# Patient Record
Sex: Male | Born: 1975 | Race: White | Hispanic: No | Marital: Single | Smoking: Former smoker
Health system: Southern US, Community
[De-identification: ages and names within clinical notes are randomized; demographics above are authoritative.]

## PROBLEM LIST (undated history)

## (undated) DIAGNOSIS — B029 Zoster without complications: Secondary | ICD-10-CM

## (undated) HISTORY — PX: WISDOM TOOTH EXTRACTION: SHX21

## (undated) HISTORY — DX: Zoster without complications: B02.9

---

## 2015-04-18 ENCOUNTER — Encounter: Payer: Self-pay | Admitting: Family Medicine

## 2015-04-18 ENCOUNTER — Ambulatory Visit (INDEPENDENT_AMBULATORY_CARE_PROVIDER_SITE_OTHER): Payer: BLUE CROSS/BLUE SHIELD | Admitting: Family Medicine

## 2015-04-18 VITALS — BP 140/82 | HR 68 | Temp 98.2°F | Resp 16 | Ht 67.0 in | Wt 152.1 lb

## 2015-04-18 DIAGNOSIS — Z1211 Encounter for screening for malignant neoplasm of colon: Secondary | ICD-10-CM | POA: Insufficient documentation

## 2015-04-18 DIAGNOSIS — Z0000 Encounter for general adult medical examination without abnormal findings: Secondary | ICD-10-CM | POA: Insufficient documentation

## 2015-04-18 NOTE — Progress Notes (Signed)
Name: Roy Edwards   MRN: [id number]    DOB: 10-10-1975   Date:04/18/2015       Progress Note  Subjective  Chief Complaint  Chief Complaint  Patient presents with  . Annual Exam    HPI  Patient here for complete H&P. His baseline health status is unchanged.  History reviewed. No pertinent past medical history.  History  Substance Use Topics  . Smoking status: Former Games developer  . Smokeless tobacco: Not on file  . Alcohol Use: 0.0 oz/week    0 Standard drinks or equivalent per week     Current outpatient prescriptions:  .  loratadine (CLARITIN) 10 MG tablet, Take 10 mg by mouth daily., Disp: , Rfl:  .  Multiple Vitamin (MULTIVITAMIN) tablet, Take 1 tablet by mouth daily., Disp: , Rfl:   No Known Allergies  Review of Systems  Constitutional: Negative for fever, chills and weight loss.  HENT: Negative for congestion, hearing loss, sore throat and tinnitus.   Eyes: Negative for blurred vision, double vision and redness.  Respiratory: Negative for cough, hemoptysis and shortness of breath.   Cardiovascular: Negative for chest pain, palpitations, orthopnea, claudication and leg swelling.  Gastrointestinal: Negative for heartburn, nausea, vomiting, diarrhea, constipation and blood in stool.  Genitourinary: Negative for dysuria, urgency, frequency and hematuria.  Musculoskeletal: Negative for myalgias, back pain, joint pain, falls and neck pain.  Skin: Negative for itching.  Neurological: Negative for dizziness, tingling, tremors, focal weakness, seizures, loss of consciousness, weakness and headaches.  Endo/Heme/Allergies: Does not bruise/bleed easily.  Psychiatric/Behavioral: Negative for depression and substance abuse. The patient is not nervous/anxious and does not have insomnia.      Objective  Filed Vitals:   04/18/15 0943  BP: 140/82  Pulse: 68  Temp: 98.2 F (36.8 C)  Resp: 16  Height:  (1.803 m)  Weight: 152 lb 1 oz (68.975 kg)  SpO2: 98%     Physical  Exam  Constitutional: He is oriented to person, place, and time and well-developed, well-nourished, and in no distress.  HENT:  Head: Normocephalic.  Eyes: EOM are normal. Pupils are equal, round, and reactive to light.  Neck: Normal range of motion. Neck supple. No thyromegaly present.  Cardiovascular: Normal rate, regular rhythm and normal heart sounds.   No murmur heard. Pulmonary/Chest: Effort normal and breath sounds normal. No respiratory distress. He has no wheezes.  Abdominal: Soft. Bowel sounds are normal.  Genitourinary: Penis normal. No discharge found.  Musculoskeletal: Normal range of motion. He exhibits no edema.  Lymphadenopathy:    He has no cervical adenopathy.  Neurological: He is alert and oriented to person, place, and time. No cranial nerve deficit. Gait normal. Coordination normal.  Skin: Skin is warm and dry. No rash noted.  Psychiatric: Affect and judgment normal.      Assessment & Plan  1. Annual physical exam  - CBC - Comprehensive metabolic panel - Lipid panel - TSH

## 2015-04-19 ENCOUNTER — Telehealth: Payer: Self-pay | Admitting: Emergency Medicine

## 2015-04-19 LAB — COMPREHENSIVE METABOLIC PANEL
ALBUMIN: 4.3 g/dL (ref 3.5–5.5)
ALT: 21 IU/L (ref 0–44)
AST: 15 IU/L (ref 0–40)
Albumin/Globulin Ratio: 1.8 (ref 1.1–2.5)
Alkaline Phosphatase: 74 IU/L (ref 39–117)
BILIRUBIN TOTAL: 1.2 mg/dL (ref 0.0–1.2)
BUN/Creatinine Ratio: 13 (ref 8–19)
BUN: 12 mg/dL (ref 6–20)
CALCIUM: 9.5 mg/dL (ref 8.7–10.2)
CHLORIDE: 100 mmol/L (ref 97–108)
CO2: 25 mmol/L (ref 18–29)
CREATININE: 0.92 mg/dL (ref 0.76–1.27)
GFR calc non Af Amer: 105 mL/min/{1.73_m2} (ref >59)
GFR, EST AFRICAN AMERICAN: 122 mL/min/{1.73_m2} (ref >59)
Globulin, Total: 2.4 g/dL (ref 1.5–4.5)
Glucose: 98 mg/dL (ref 65–99)
POTASSIUM: 4.9 mmol/L (ref 3.5–5.2)
Sodium: 139 mmol/L (ref 134–144)
Total Protein: 6.7 g/dL (ref 6.0–8.5)

## 2015-04-19 LAB — LIPID PANEL
CHOL/HDL RATIO: 4 ratio (ref 0.0–5.0)
Cholesterol, Total: 181 mg/dL (ref 100–199)
HDL: 45 mg/dL (ref >39)
LDL CALC: 116 mg/dL — AB (ref 0–99)
Triglycerides: 102 mg/dL (ref 0–149)
VLDL CHOLESTEROL CAL: 20 mg/dL (ref 5–40)

## 2015-04-19 LAB — CBC
Hematocrit: 47 % (ref 37.5–51.0)
Hemoglobin: 16.4 g/dL (ref 12.6–17.7)
MCH: 32.1 pg (ref 26.6–33.0)
MCHC: 34.9 g/dL (ref 31.5–35.7)
MCV: 92 fL (ref 79–97)
Platelets: 277 10*3/uL (ref 150–379)
RBC: 5.11 x10E6/uL (ref 4.14–5.80)
RDW: 14.5 % (ref 12.3–15.4)
WBC: 7.4 10*3/uL (ref 3.4–10.8)

## 2015-04-19 LAB — TSH: TSH: 1.18 u[IU]/mL (ref 0.450–4.500)

## 2015-04-19 NOTE — Telephone Encounter (Signed)
Patient notified

## 2015-08-15 ENCOUNTER — Ambulatory Visit (INDEPENDENT_AMBULATORY_CARE_PROVIDER_SITE_OTHER): Payer: BLUE CROSS/BLUE SHIELD | Admitting: Family Medicine

## 2015-08-15 ENCOUNTER — Encounter: Payer: Self-pay | Admitting: Family Medicine

## 2015-08-15 VITALS — BP 126/82 | HR 72 | Temp 98.1°F | Resp 16 | Ht 67.0 in | Wt 153.9 lb

## 2015-08-15 DIAGNOSIS — B029 Zoster without complications: Secondary | ICD-10-CM

## 2015-08-15 MED ORDER — VALACYCLOVIR HCL 1 G PO TABS: 1000.0000 mg | ORAL_TABLET | Freq: Two times a day (BID) | ORAL | Status: DC

## 2015-08-15 MED ORDER — PREDNISONE 20 MG PO TABS
20.0000 mg | ORAL_TABLET | Freq: Every day | ORAL | Status: DC
Start: 2015-08-15 — End: 2016-05-14

## 2015-08-15 NOTE — Patient Instructions (Signed)

## 2015-08-15 NOTE — Progress Notes (Signed)
Name: Roy SheerJohn Edwards   MRN: [id number]    DOB: 11/03/1975   Date:08/15/2015       Progress Note  Subjective  Chief Complaint  Chief Complaint  Patient presents with  . Rash    in middle of back onset 3-4 days denies itching but painful.  Ulcerations and ozzing possible shingles, has been under alot of stress.    HPI  Herpes zoster.  Patient complains of a rash that began on his left posterior thoracic area about 23 dictating days ago. A few days before he B Cantu experience some tingling and burning sensation before a blistering rash appeared. There's been no fever or chills and no prior exposure  History reviewed. No pertinent past medical history.  Social History  Substance Use Topics  . Smoking status: Former Games developermoker  . Smokeless tobacco: Not on file  . Alcohol Use: 0.0 oz/week    0 Standard drinks or equivalent per week     Current outpatient prescriptions:  .  loratadine (CLARITIN) 10 MG tablet, Take 10 mg by mouth daily., Disp: , Rfl:  .  Multiple Vitamin (MULTIVITAMIN) tablet, Take 1 tablet by mouth daily., Disp: , Rfl:  .  predniSONE (DELTASONE) 20 MG tablet, Take 1 tablet (20 mg total) by mouth daily with breakfast., Disp: 10 tablet, Rfl: 0 .  valACYclovir (VALTREX) 1000 MG tablet, Take 1 tablet (1,000 mg total) by mouth 2 (two) times daily., Disp: 20 tablet, Rfl: 0  No Known Allergies  Review of Systems  Constitutional: Negative.   Skin: Positive for rash.     Objective  Filed Vitals:   08/15/15 1137  BP: 126/82  Pulse: 72  Temp: 98.1 F (36.7 C)  TempSrc: Oral  Resp: 16  Height: 5\' 7"  (1.702 m)  Weight: 153 lb 14.4 oz (69.809 kg)  SpO2: 99%     Physical Exam  Constitutional: He is well-developed, well-nourished, and in no distress.  HENT:  Head: Normocephalic.  Eyes: Pupils are equal, round, and reactive to light.  Neck: Normal range of motion.  Skin: Rash:  herpetic type rash and approximately her T 8 distribution left posterior rib area.       Assessment & Plan  1. Shingles Roy DaviesHannah Edwards - valACYclovir (VALTREX) 1000 MG tablet; Take 1 tablet (1,000 mg total) by mouth 2 (two) times daily.  Dispense: 20 tablet; Refill: 0 - predniSONE (DELTASONE) 20 MG tablet; Take 1 tablet (20 mg total) by mouth daily with breakfast.  Dispense: 10 tablet; Refill: 0

## 2016-04-23 ENCOUNTER — Encounter: Payer: BLUE CROSS/BLUE SHIELD | Admitting: Family Medicine

## 2016-05-14 ENCOUNTER — Encounter: Payer: Self-pay | Admitting: Family Medicine

## 2016-05-14 ENCOUNTER — Ambulatory Visit (INDEPENDENT_AMBULATORY_CARE_PROVIDER_SITE_OTHER): Payer: BLUE CROSS/BLUE SHIELD | Admitting: Family Medicine

## 2016-05-14 VITALS — BP 122/76 | HR 66 | Temp 98.7°F | Resp 16 | Ht 67.0 in | Wt 158.8 lb

## 2016-05-14 DIAGNOSIS — Z0000 Encounter for general adult medical examination without abnormal findings: Secondary | ICD-10-CM

## 2016-05-14 LAB — LIPID PANEL
Cholesterol: 159 mg/dL (ref 125–200)
HDL: 38 mg/dL — ABNORMAL LOW (ref >=40)
LDL Cholesterol: 90 mg/dL (ref <130)
Total CHOL/HDL Ratio: 4.2 Ratio (ref <=5.0)
Triglycerides: 157 mg/dL — ABNORMAL HIGH (ref <150)
VLDL: 31 mg/dL — ABNORMAL HIGH (ref <30)

## 2016-05-14 LAB — CBC WITH DIFFERENTIAL/PLATELET
Basophils Absolute: 0 cells/uL (ref 0–200)
Basophils Relative: 0 %
EOS PCT: 2 %
Eosinophils Absolute: 160 cells/uL (ref 15–500)
HCT: 47.8 % (ref 38.5–50.0)
Hemoglobin: 16.4 g/dL (ref 13.2–17.1)
LYMPHS ABS: 1600 {cells}/uL (ref 850–3900)
LYMPHS PCT: 20 %
MCH: 32 pg (ref 27.0–33.0)
MCHC: 34.3 g/dL (ref 32.0–36.0)
MCV: 93.4 fL (ref 80.0–100.0)
MONOS PCT: 7 %
MPV: 10.9 fL (ref 7.5–12.5)
Monocytes Absolute: 560 cells/uL (ref 200–950)
NEUTROS PCT: 71 %
Neutro Abs: 5680 cells/uL (ref 1500–7800)
PLATELETS: 281 10*3/uL (ref 140–400)
RBC: 5.12 MIL/uL (ref 4.20–5.80)
RDW: 13.4 % (ref 11.0–15.0)
WBC: 8 10*3/uL (ref 3.8–10.8)

## 2016-05-14 LAB — COMPLETE METABOLIC PANEL WITH GFR
ALT: 19 U/L (ref 9–46)
AST: 15 U/L (ref 10–40)
Albumin: 4.5 g/dL (ref 3.6–5.1)
Alkaline Phosphatase: 53 U/L (ref 40–115)
BUN: 11 mg/dL (ref 7–25)
CHLORIDE: 104 mmol/L (ref 98–110)
CO2: 29 mmol/L (ref 20–31)
Calcium: 9.4 mg/dL (ref 8.6–10.3)
Creat: 0.96 mg/dL (ref 0.60–1.35)
GFR, Est African American: >89 mL/min (ref >=60)
GFR, Est Non African American: >89 mL/min (ref >=60)
GLUCOSE: 99 mg/dL (ref 65–99)
POTASSIUM: 5 mmol/L (ref 3.5–5.3)
SODIUM: 140 mmol/L (ref 135–146)
Total Bilirubin: 0.7 mg/dL (ref 0.2–1.2)
Total Protein: 6.6 g/dL (ref 6.1–8.1)

## 2016-05-14 LAB — TSH: TSH: 1.21 mIU/L (ref 0.40–4.50)

## 2016-05-14 NOTE — Progress Notes (Signed)
Name: Roy Edwards   MRN: [id number]    DOB: 01/28/1976   Date:05/14/2016       Progress Note  Subjective  Chief Complaint  Chief Complaint  Patient presents with  . Annual Exam  This patient is usually followed by Dr. Thana AtesMorrisey, new to me  HPI  Pt. Presents for a Complete Physical Exam.   Past Medical History:  Diagnosis Date  . Shingles     History reviewed. No pertinent surgical history.  Family History  Problem Relation Age of Onset  . Graves' disease Mother   . Diabetes Father   . Atrial fibrillation Father     Social History   Social History  . Marital status: Single    Spouse name: N/A  . Number of children: N/A  . Years of education: N/A   Occupational History  . Not on file.   Social History Main Topics  . Smoking status: Former Games developermoker  . Smokeless tobacco: Never Used  . Alcohol use No  . Drug use: No  . Sexual activity: No   Other Topics Concern  . Not on file   Social History Narrative  . No narrative on file     Current Outpatient Prescriptions:  Marland Kitchen.  Multiple Vitamin (MULTIVITAMIN) tablet, Take 1 tablet by mouth daily., Disp: , Rfl:   No Known Allergies   Review of Systems  Constitutional: Negative for chills and fever.  Eyes: Negative for blurred vision and double vision.  Cardiovascular: Negative for chest pain.  Gastrointestinal: Negative for abdominal pain, blood in stool, nausea and vomiting.  Genitourinary: Negative for hematuria.  Musculoskeletal: Negative for back pain and neck pain.  Skin: Negative for rash.  Neurological: Negative for headaches.  Psychiatric/Behavioral: Negative for depression. The patient is not nervous/anxious.      Objective  Vitals:   05/14/16 0909  BP: 122/76  Pulse: 66  Resp: 16  Temp: 98.7 F (37.1 C)  TempSrc: Oral  SpO2: 96%  Weight: 158 lb 12.8 oz (72 kg)  Height: 5\' 7"  (1.702 m)    Physical Exam  Constitutional: He is oriented to person, place, and time and well-developed,  well-nourished, and in no distress.  HENT:  Head: Normocephalic and atraumatic.  Right Ear: Tympanic membrane, external ear and ear canal normal.  Left Ear: Tympanic membrane, external ear and ear canal normal.  Mouth/Throat: Oropharynx is clear and moist. No posterior oropharyngeal erythema.  Eyes: Pupils are equal, round, and reactive to light.  Cardiovascular: Normal rate, regular rhythm and normal heart sounds.   No murmur heard. Pulmonary/Chest: Effort normal and breath sounds normal. He has no wheezes.  Abdominal: Soft. Bowel sounds are normal. There is no tenderness.  Genitourinary:  Genitourinary Comments: Deferred.  Musculoskeletal:       Right ankle: He exhibits no swelling.       Left ankle: He exhibits no swelling.  Neurological: He is alert and oriented to person, place, and time.  Psychiatric: Mood, memory, affect and judgment normal.  Nursing note and vitals reviewed.    Assessment & Plan  1. Annual physical exam Age appropriate screening lab work obtained.  - Lipid Profile - CBC with Differential - COMPLETE METABOLIC PANEL WITH GFR - TSH - Vitamin D (25 hydroxy)   Wynne Jury Asad A. Faylene [facility] 05/14/2016 9:18 AM

## 2016-05-15 LAB — VITAMIN D 25 HYDROXY (VIT D DEFICIENCY, FRACTURES): Vit D, 25-Hydroxy: 48 ng/mL (ref 30–100)

## 2016-09-29 DIAGNOSIS — Z23 Encounter for immunization: Secondary | ICD-10-CM | POA: Diagnosis not present

## 2017-05-11 DIAGNOSIS — J0100 Acute maxillary sinusitis, unspecified: Secondary | ICD-10-CM | POA: Diagnosis not present

## 2017-05-11 DIAGNOSIS — J209 Acute bronchitis, unspecified: Secondary | ICD-10-CM | POA: Diagnosis not present

## 2017-05-15 ENCOUNTER — Encounter: Payer: BLUE CROSS/BLUE SHIELD | Admitting: Family Medicine

## 2017-05-31 ENCOUNTER — Encounter: Payer: BLUE CROSS/BLUE SHIELD | Admitting: Family Medicine

## 2017-06-20 DIAGNOSIS — Z23 Encounter for immunization: Secondary | ICD-10-CM | POA: Diagnosis not present

## 2017-06-25 ENCOUNTER — Ambulatory Visit (INDEPENDENT_AMBULATORY_CARE_PROVIDER_SITE_OTHER): Payer: BLUE CROSS/BLUE SHIELD | Admitting: Family Medicine

## 2017-06-25 ENCOUNTER — Encounter: Payer: Self-pay | Admitting: Family Medicine

## 2017-06-25 VITALS — BP 117/80 | HR 71 | Ht 68.0 in | Wt 159.0 lb

## 2017-06-25 DIAGNOSIS — Z0000 Encounter for general adult medical examination without abnormal findings: Secondary | ICD-10-CM

## 2017-06-25 NOTE — Progress Notes (Signed)
Name: Roy Edwards   MRN: [id number]    DOB: 04/23/1976   Date:06/25/2017       Progress Note  Subjective  Chief Complaint  Chief Complaint  Patient presents with  . Annual Exam    Flu Vaccine last week at work     HPI  Pt. Presents for complete physical exam.  He is due for tetanus vaccine, received flu shot at work.    Past Medical History:  Diagnosis Date  . Shingles     History reviewed. No pertinent surgical history.  Family History  Problem Relation Age of Onset  . Graves' disease Mother   . Diabetes Father   . Atrial fibrillation Father     Social History   Social History  . Marital status: Single    Spouse name: N/A  . Number of children: N/A  . Years of education: N/A   Occupational History  . Not on file.   Social History Main Topics  . Smoking status: Former Games developer  . Smokeless tobacco: Never Used  . Alcohol use No  . Drug use: No  . Sexual activity: No   Other Topics Concern  . Not on file   Social History Narrative  . No narrative on file     Current Outpatient Prescriptions:  Marland Kitchen  Multiple Vitamin (MULTIVITAMIN) tablet, Take 1 tablet by mouth daily., Disp: , Rfl:   No Known Allergies   Review of Systems  Constitutional: Negative for chills, fever, malaise/fatigue and weight loss.  HENT: Negative for congestion, ear pain and sore throat.   Eyes: Negative for blurred vision and double vision.  Respiratory: Negative for cough, sputum production and shortness of breath.   Cardiovascular: Negative for chest pain, palpitations and leg swelling.  Gastrointestinal: Negative for abdominal pain, blood in stool, constipation, diarrhea, nausea and vomiting.  Genitourinary: Negative for dysuria, hematuria and urgency.  Musculoskeletal: Positive for back pain (occasional ower back pain). Negative for joint pain and neck pain.  Skin: Negative for rash.  Neurological: Negative for dizziness and headaches.  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 The patient is not nervous/anxious and does not have insomnia.     Objective  Vitals:   06/25/17 1217  BP: 117/80  Pulse: 71  SpO2: 96%  Weight: 159 lb (72.1 kg)  Height:  (1.727 m)    Physical Exam  Constitutional: He is oriented to person, place, and time and well-developed, well-nourished, and in no distress.  HENT:  Head: Normocephalic and atraumatic.  Right Ear: Tympanic membrane, external ear and ear canal normal.  Left Ear: Tympanic membrane, external ear and ear canal normal.  Mouth/Throat: Oropharynx is clear and moist. No posterior oropharyngeal erythema.  Eyes: Pupils are equal, round, and reactive to light.  Cardiovascular: Normal rate, regular rhythm and normal heart sounds.   No murmur heard. Pulmonary/Chest: Effort normal and breath sounds normal. He has no wheezes.  Abdominal: Soft. Bowel sounds are normal. There is no tenderness.  Genitourinary: Prostate normal. Rectal exam shows mass (skin colored peri-rectal mass liklely a hemorrhoid remnant.). Prostate is not enlarged and not tender.  Musculoskeletal:       Right ankle: He exhibits no swelling.       Left ankle: He exhibits no swelling.  Neurological: He is alert and oriented to person, place, and time.  Psychiatric: Mood, memory, affect and judgment normal.  Nursing note and vitals reviewed.     Assessment & Plan  1. Annual physical exam Obtain age-appropriate  laboratory screenings - CBC with Differential/Platelet - COMPLETE METABOLIC PANEL WITH GFR - Lipid panel - TSH - VITAMIN D 25 Hydroxy (Vit-D Deficiency, Fractures)   Dorathea Faerber Asad A. Fay[facility] 06/25/2017 12:35 PM

## 2017-06-26 LAB — LIPID PANEL
CHOL/HDL RATIO: 4.5 (calc) (ref <5.0)
Cholesterol: 191 mg/dL (ref <200)
HDL: 42 mg/dL (ref >40)
LDL CHOLESTEROL (CALC): 121 mg/dL — AB
Non-HDL Cholesterol (Calc): 149 mg/dL (calc) — ABNORMAL HIGH (ref <130)
Triglycerides: 159 mg/dL — ABNORMAL HIGH (ref <150)

## 2017-06-26 LAB — COMPLETE METABOLIC PANEL WITH GFR
AG Ratio: 2 (calc) (ref 1.0–2.5)
ALBUMIN MSPROF: 4.5 g/dL (ref 3.6–5.1)
ALT: 23 U/L (ref 9–46)
AST: 20 U/L (ref 10–40)
Alkaline phosphatase (APISO): 62 U/L (ref 40–115)
BUN: 9 mg/dL (ref 7–25)
CO2: 29 mmol/L (ref 20–32)
CREATININE: 0.95 mg/dL (ref 0.60–1.35)
Calcium: 9.5 mg/dL (ref 8.6–10.3)
Chloride: 103 mmol/L (ref 98–110)
GFR, EST AFRICAN AMERICAN: 116 mL/min/{1.73_m2} (ref >=60)
GFR, Est Non African American: 100 mL/min/{1.73_m2} (ref >=60)
GLOBULIN: 2.3 g/dL (ref 1.9–3.7)
GLUCOSE: 85 mg/dL (ref 65–99)
Potassium: 3.9 mmol/L (ref 3.5–5.3)
SODIUM: 141 mmol/L (ref 135–146)
TOTAL PROTEIN: 6.8 g/dL (ref 6.1–8.1)
Total Bilirubin: 1.5 mg/dL — ABNORMAL HIGH (ref 0.2–1.2)

## 2017-06-26 LAB — CBC WITH DIFFERENTIAL/PLATELET
BASOS ABS: 62 {cells}/uL (ref 0–200)
BASOS PCT: 0.7 %
EOS ABS: 123 {cells}/uL (ref 15–500)
Eosinophils Relative: 1.4 %
HEMATOCRIT: 46.6 % (ref 38.5–50.0)
HEMOGLOBIN: 16.1 g/dL (ref 13.2–17.1)
LYMPHS ABS: 1804 {cells}/uL (ref 850–3900)
MCH: 31.4 pg (ref 27.0–33.0)
MCHC: 34.5 g/dL (ref 32.0–36.0)
MCV: 90.8 fL (ref 80.0–100.0)
MONOS PCT: 6.6 %
MPV: 10.7 fL (ref 7.5–12.5)
NEUTROS ABS: 6230 {cells}/uL (ref 1500–7800)
Neutrophils Relative %: 70.8 %
Platelets: 320 10*3/uL (ref 140–400)
RBC: 5.13 10*6/uL (ref 4.20–5.80)
RDW: 12.8 % (ref 11.0–15.0)
Total Lymphocyte: 20.5 %
WBC: 8.8 10*3/uL (ref 3.8–10.8)
WBCMIX: 581 {cells}/uL (ref 200–950)

## 2017-06-26 LAB — TSH: TSH: 0.95 m[IU]/L (ref 0.40–4.50)

## 2017-06-26 LAB — VITAMIN D 25 HYDROXY (VIT D DEFICIENCY, FRACTURES): VIT D 25 HYDROXY: 33 ng/mL (ref 30–100)

## 2017-06-28 ENCOUNTER — Telehealth: Payer: Self-pay

## 2017-06-28 DIAGNOSIS — R799 Abnormal finding of blood chemistry, unspecified: Secondary | ICD-10-CM

## 2017-06-28 NOTE — Telephone Encounter (Signed)
Called pt no answer. LM informing pt of the information below. Lab order placed as future. PT to call back for questions or concerns.

## 2017-06-28 NOTE — Telephone Encounter (Signed)
-----   Message from Ellyn Hack, MD sent at 06/26/2017  7:49 PM EDT ----- CBC shows normal white count, hemoglobin, hematocrit and platelets CMP shows normal serum glucose, electrolytes, kidney function and liver enzymes. Marginally elevated total bilirubin level at 1.5 mg/dL which should be repeated in 1-2 weeks FLP shows elevated triglycerides at 159 and elevated LDL at 1 21 mg/dL, normal total cholesterol and HDL. He should increase physical activity, improve his diet by avoiding foods that are high in cholesterol, recheck in 6 months TSH is within normal range Vitamin D is within normal range

## 2017-07-01 NOTE — Addendum Note (Signed)
Addended by: Frankey Shown on: 07/01/2017 08:51 AM   Modules accepted: Orders

## 2017-07-10 ENCOUNTER — Other Ambulatory Visit: Payer: Self-pay

## 2017-07-10 LAB — COMPREHENSIVE METABOLIC PANEL
AG RATIO: 2 (calc) (ref 1.0–2.5)
ALBUMIN MSPROF: 4.6 g/dL (ref 3.6–5.1)
ALT: 16 U/L (ref 9–46)
AST: 15 U/L (ref 10–40)
Alkaline phosphatase (APISO): 61 U/L (ref 40–115)
BUN: 11 mg/dL (ref 7–25)
CHLORIDE: 103 mmol/L (ref 98–110)
CO2: 31 mmol/L (ref 20–32)
CREATININE: 0.98 mg/dL (ref 0.60–1.35)
Calcium: 9.7 mg/dL (ref 8.6–10.3)
GLOBULIN: 2.3 g/dL (ref 1.9–3.7)
GLUCOSE: 97 mg/dL (ref 65–99)
POTASSIUM: 4.8 mmol/L (ref 3.5–5.3)
SODIUM: 140 mmol/L (ref 135–146)
Total Bilirubin: 1.3 mg/dL — ABNORMAL HIGH (ref 0.2–1.2)
Total Protein: 6.9 g/dL (ref 6.1–8.1)

## 2017-08-07 ENCOUNTER — Encounter: Payer: Self-pay | Admitting: Family Medicine

## 2017-08-07 ENCOUNTER — Ambulatory Visit: Payer: BLUE CROSS/BLUE SHIELD | Admitting: Family Medicine

## 2017-08-07 NOTE — Progress Notes (Signed)
Name: Roy Edwards   MRN: [id number]    DOB: 12/05/1975   Date:08/07/2017       Progress Note  Subjective  Chief Complaint  Chief Complaint  Patient presents with  . abnormal labs    persistently elevated bilirubin levels;   . Jaundice    abnormal labs    HPI  Pt. Presents for elevated bilirubin level of 1.3 mg/dL, his last level drawn was 1.3 mg/dL, 2 weeks earlier, his level was 1.5 mg/dL, liver enzymes were normal. He denies any family history of liver disease, denies any symptoms of jaundice, has never been exposed to Hepatitis etc.    Past Medical History:  Diagnosis Date  . Shingles     No past surgical history on file.  Family History  Problem Relation Age of Onset  . Graves' disease Mother   . Diabetes Father   . Atrial fibrillation Father     Social History   Socioeconomic History  . Marital status: Single    Spouse name: Not on file  . Number of children: Not on file  . Years of education: Not on file  . Highest education level: Not on file  Social Needs  . Financial resource strain: Not on file  . Food insecurity - worry: Not on file  . Food insecurity - inability: Not on file  . Transportation needs - medical: Not on file  . Transportation needs - non-medical: Not on file  Occupational History  . Not on file  Tobacco Use  . Smoking status: Former Games developermoker  . Smokeless tobacco: Never Used  Substance and Sexual Activity  . Alcohol use: No    Alcohol/week: 0.0 oz  . Drug use: No  . Sexual activity: No    Birth control/protection: None  Other Topics Concern  . Not on file  Social History Narrative  . Not on file     Current Outpatient Medications:  Marland Kitchen.  Multiple Vitamin (MULTIVITAMIN) tablet, Take 1 tablet by mouth daily., Disp: , Rfl:   No Known Allergies   ROS  Please see history of present illness for complete discussion of ROS  Objective  Vitals:   08/07/17 1026  BP: 124/76  Pulse: 95  Resp: 16  Temp: 98.2 F (36.8 C)   TempSrc: Oral  SpO2: 98%  Weight: 159 lb 4.8 oz (72.3 kg)  Height: 5\' 7"  (1.702 m)    Physical Exam  Constitutional: He is well-developed, well-nourished, and in no distress.  Cardiovascular: Normal rate, regular rhythm and normal heart sounds.  No murmur heard. Pulmonary/Chest: Effort normal and breath sounds normal. No respiratory distress. He has no wheezes.  Abdominal: Soft. Bowel sounds are normal. There is hepatomegaly. There is no tenderness.  Nursing note and vitals reviewed.   Recent Results (from the past 2160 hour(s))  CBC with Differential/Platelet     Status: None   Collection Time: 06/25/17 12:58 PM  Result Value Ref Range   WBC 8.8 3.8 - 10.8 Thousand/uL   RBC 5.13 4.20 - 5.80 Million/uL   Hemoglobin 16.1 13.2 - 17.1 g/dL   HCT 40.946.6 81.138.5 - 91.450.0 %   MCV 90.8 80.0 - 100.0 fL   MCH 31.4 27.0 - 33.0 pg   MCHC 34.5 32.0 - 36.0 g/dL   RDW 78.212.8 95.611.0 - 21.315.0 %   Platelets 320 140 - 400 Thousand/uL   MPV 10.7 7.5 - 12.5 fL   Neutro Abs 6,230 1,500 - 7,800 cells/uL   Lymphs Abs 1,804 850 - 3,900  cells/uL   WBC mixed population 581 200 - 950 cells/uL   Eosinophils Absolute 123 15 - 500 cells/uL   Basophils Absolute 62 0 - 200 cells/uL   Neutrophils Relative % 70.8 %   Total Lymphocyte 20.5 %   Monocytes Relative 6.6 %   Eosinophils Relative 1.4 %   Basophils Relative 0.7 %  COMPLETE METABOLIC PANEL WITH GFR     Status: Abnormal   Collection Time: 06/25/17 12:58 PM  Result Value Ref Range   Glucose, Bld 85 65 - 99 mg/dL    Comment: .            Fasting reference interval .    BUN 9 7 - 25 mg/dL   Creat 1.610.95 0.960.60 - 0.451.35 mg/dL   GFR, Est Non African American 100 > OR = 60 mL/min/1.6573m2   GFR, Est African American 116 > OR = 60 mL/min/1.7773m2   BUN/Creatinine Ratio NOT APPLICABLE 6 - 22 (calc)   Sodium 141 135 - 146 mmol/L   Potassium 3.9 3.5 - 5.3 mmol/L   Chloride 103 98 - 110 mmol/L   CO2 29 20 - 32 mmol/L   Calcium 9.5 8.6 - 10.3 mg/dL   Total Protein 6.8  6.1 - 8.1 g/dL   Albumin 4.5 3.6 - 5.1 g/dL   Globulin 2.3 1.9 - 3.7 g/dL (calc)   AG Ratio 2.0 1.0 - 2.5 (calc)   Total Bilirubin 1.5 (H) 0.2 - 1.2 mg/dL   Alkaline phosphatase (APISO) 62 40 - 115 U/L   AST 20 10 - 40 U/L   ALT 23 9 - 46 U/L  Lipid panel     Status: Abnormal   Collection Time: 06/25/17 12:58 PM  Result Value Ref Range   Cholesterol 191 <200 mg/dL   HDL 42 >40>40 mg/dL   Triglycerides 981159 (H) <150 mg/dL   LDL Cholesterol (Calc) 121 (H) mg/dL (calc)    Comment: Reference range: <100 . Desirable range <100 mg/dL for primary prevention;   <70 mg/dL for patients with CHD or diabetic patients  with > or = 2 CHD risk factors. Marland Kitchen. LDL-C is now calculated using the Martin-Hopkins  calculation, which is a validated novel method providing  better accuracy than the Friedewald equation in the  estimation of LDL-C.  Horald PollenMartin SS et al. Lenox AhrJAMA. 1914;782(952013;310(19): 2061-2068  (http://education.[facility].com/faq/FAQ164)    Total CHOL/HDL Ratio 4.5 <5.0 (calc)   Non-HDL Cholesterol (Calc) 149 (H) <130 mg/dL (calc)    Comment: For patients with diabetes plus 1 major ASCVD risk  factor, treating to a non-HDL-C goal of <100 mg/dL  (LDL-C of <62<70 mg/dL) is considered a therapeutic  option.   TSH     Status: None   Collection Time: 06/25/17 12:58 PM  Result Value Ref Range   TSH 0.95 0.40 - 4.50 mIU/L  VITAMIN D 25 Hydroxy (Vit-D Deficiency, Fractures)     Status: None   Collection Time: 06/25/17 12:58 PM  Result Value Ref Range   Vit D, 25-Hydroxy 33 30 - 100 ng/mL    Comment: Vitamin D Status         25-OH Vitamin D: . Deficiency:                    <20 ng/mL Insufficiency:             20 - 29 ng/mL Optimal:                 > or = 30 ng/mL .  For 25-OH Vitamin D testing on patients on  D2-supplementation and patients for whom quantitation  of D2 and D3 fractions is required, the Quest[facility]ureD(TM) 25-OH VIT D, (D2,D3), LC/MS/MS is recommended: order  code 16109 (patients  >32yrs). . For more information on this test, go to: http://education.[facility].com/faq/FAQ163 (This link is being provided for  informational/educational purposes only.)   Comprehensive Metabolic Panel (CMET)     Status: Abnormal   Collection Time: 07/10/17  9:34 AM  Result Value Ref Range   Glucose, Bld 97 65 - 99 mg/dL    Comment: .            Fasting reference interval .    BUN 11 7 - 25 mg/dL   Creat 6.04 5.40 - 9.81 mg/dL   BUN/Creatinine Ratio NOT APPLICABLE 6 - 22 (calc)   Sodium 140 135 - 146 mmol/L   Potassium 4.8 3.5 - 5.3 mmol/L   Chloride 103 98 - 110 mmol/L   CO2 31 20 - 32 mmol/L   Calcium 9.7 8.6 - 10.3 mg/dL   Total Protein 6.9 6.1 - 8.1 g/dL   Albumin 4.6 3.6 - 5.1 g/dL   Globulin 2.3 1.9 - 3.7 g/dL (calc)   AG Ratio 2.0 1.0 - 2.5 (calc)   Total Bilirubin 1.3 (H) 0.2 - 1.2 mg/dL   Alkaline phosphatase (APISO) 61 40 - 115 U/L   AST 15 10 - 40 U/L   ALT 16 9 - 46 U/L     Assessment & Plan  1. 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 [facility] 08/07/2017 10:38 AM

## 2017-08-08 LAB — HEPATIC FUNCTION PANEL
AG RATIO: 2 (calc) (ref 1.0–2.5)
ALKALINE PHOSPHATASE (APISO): 68 U/L (ref 40–115)
ALT: 42 U/L (ref 9–46)
AST: 27 U/L (ref 10–40)
Albumin: 4.7 g/dL (ref 3.6–5.1)
BILIRUBIN TOTAL: 1.5 mg/dL — AB (ref 0.2–1.2)
Bilirubin, Direct: 0.3 mg/dL — ABNORMAL HIGH (ref 0.0–0.2)
Globulin: 2.3 g/dL (calc) (ref 1.9–3.7)
Indirect Bilirubin: 1.2 mg/dL (calc) (ref 0.2–1.2)
Total Protein: 7 g/dL (ref 6.1–8.1)

## 2017-08-08 LAB — HEPATITIS PANEL, ACUTE
HEP A IGM: NONREACTIVE
HEP B C IGM: NONREACTIVE
HEP B S AG: NONREACTIVE
Hepatitis C Ab: NONREACTIVE
SIGNAL TO CUT-OFF: 0.01 (ref <1.00)

## 2017-08-19 ENCOUNTER — Ambulatory Visit
Admission: RE | Admit: 2017-08-19 | Discharge: 2017-08-19 | Disposition: A | Discharge status: Home / Self Care | Payer: BLUE CROSS/BLUE SHIELD | Source: Ambulatory Visit | Attending: Family Medicine | Admitting: Family Medicine

## 2017-08-19 DIAGNOSIS — R945 Abnormal results of liver function studies: Secondary | ICD-10-CM | POA: Diagnosis not present

## 2017-10-25 DIAGNOSIS — J0100 Acute maxillary sinusitis, unspecified: Secondary | ICD-10-CM | POA: Diagnosis not present

## 2018-06-23 DIAGNOSIS — Z23 Encounter for immunization: Secondary | ICD-10-CM | POA: Diagnosis not present

## 2019-09-14 IMAGING — US US ABDOMEN LIMITED
1 series · 14 of 25 positions shown · non-contrast
Comparison: None.

CLINICAL DATA: 41-year-old male with elevated bilirubin. Initial
encounter.

EXAM:
ULTRASOUND ABDOMEN LIMITED RIGHT UPPER QUADRANT

[Series 1: us abdomen limited · 0.19mm/px · 14 of 39 slices shown]
[im 1/39]
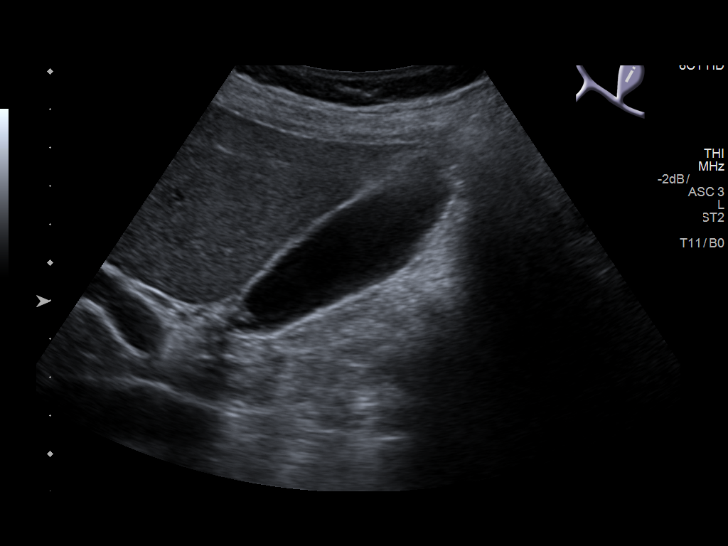
[im 4/39]
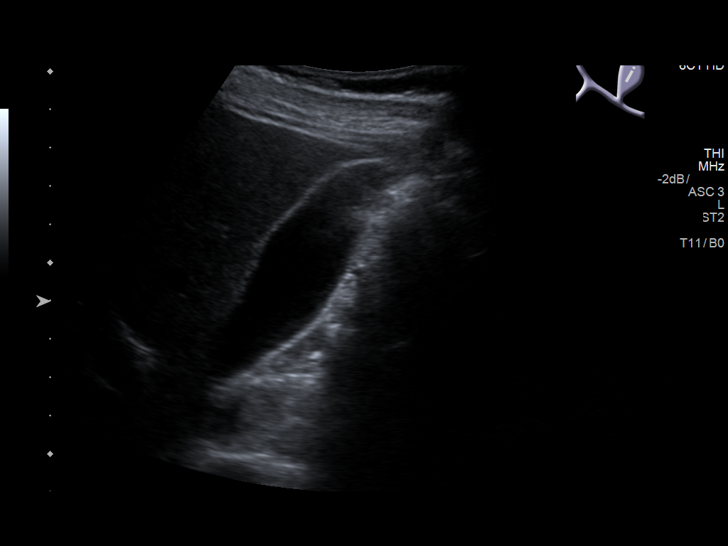
[im 7/39]
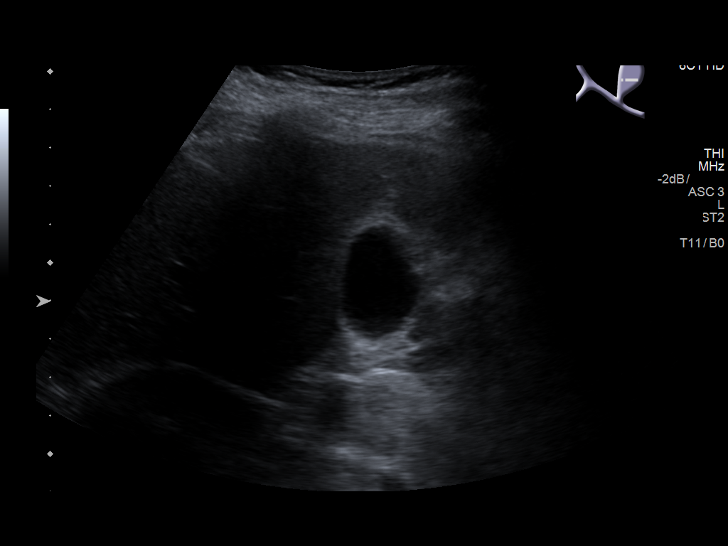
[im 10/39]
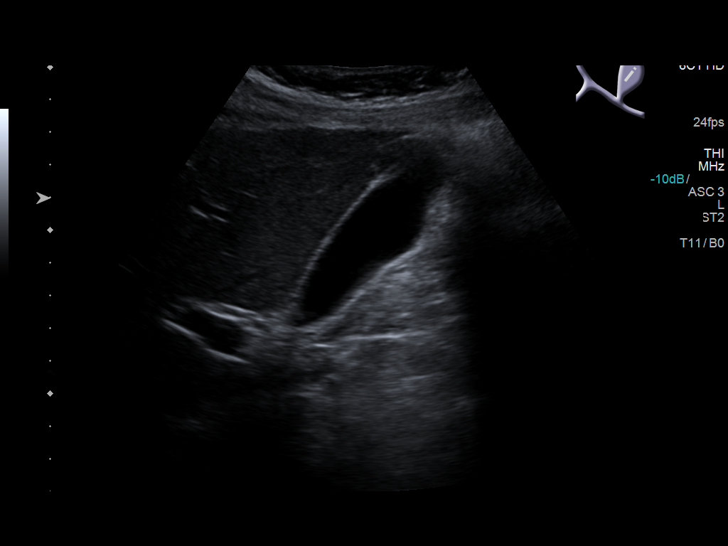
[im 13/39]
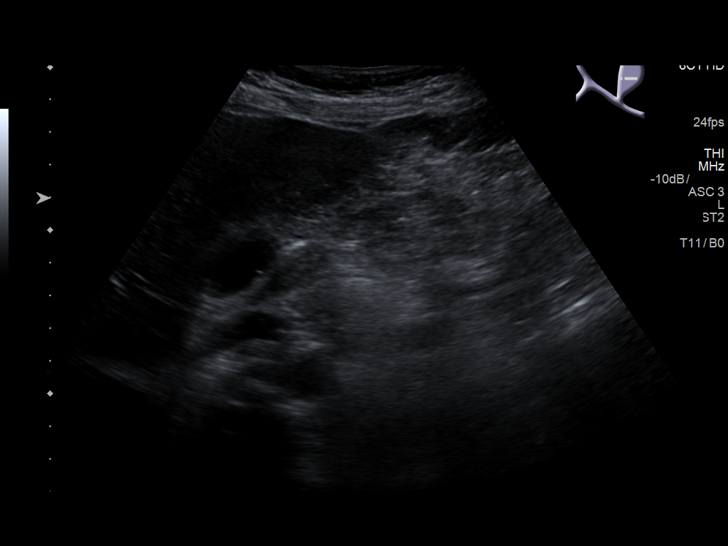
[im 15/39]
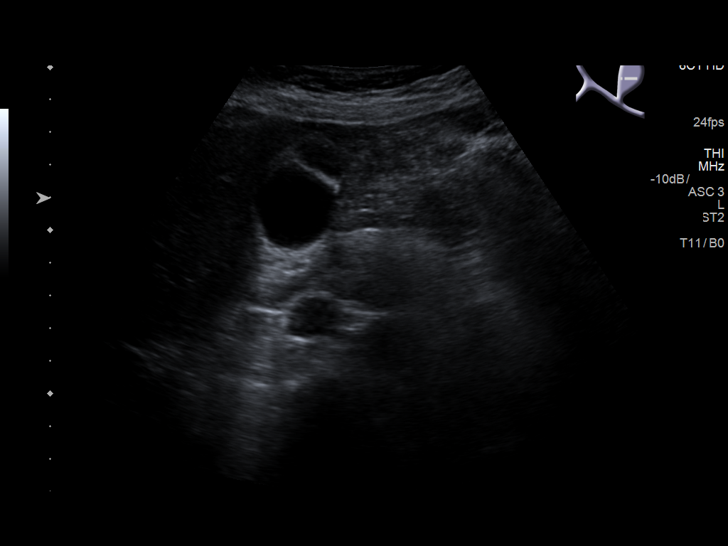
[im 18/39]
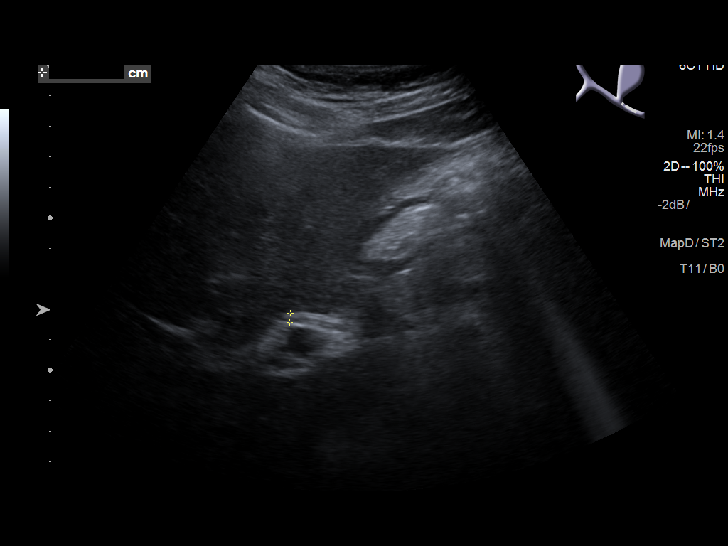
[im 21/39]
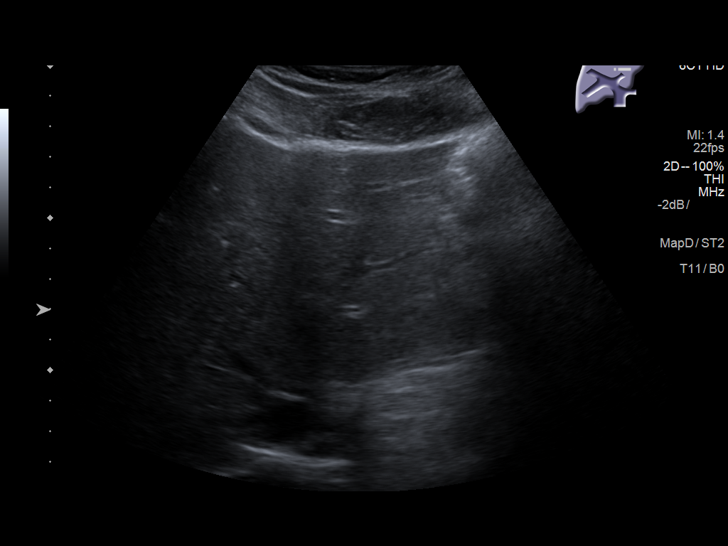
[im 24/39]
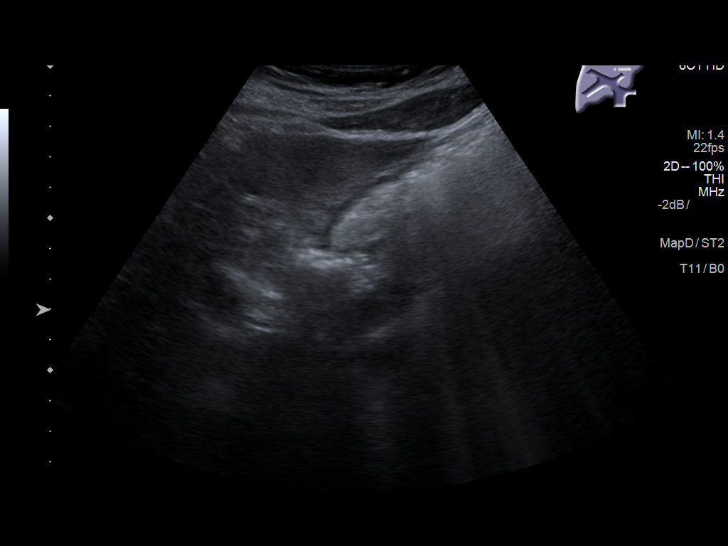
[im 26/39]
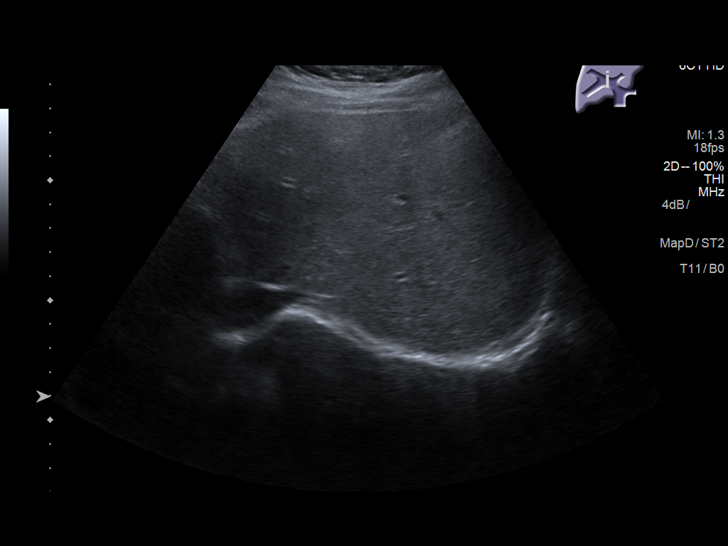
[im 29/39]
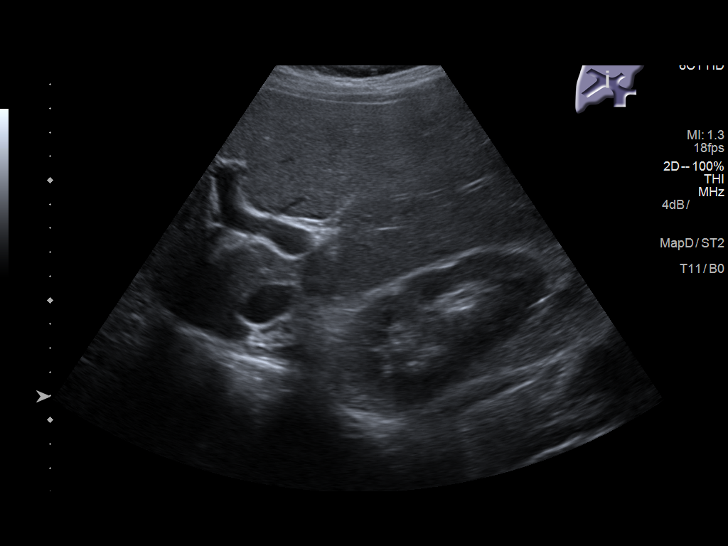
[im 32/39]
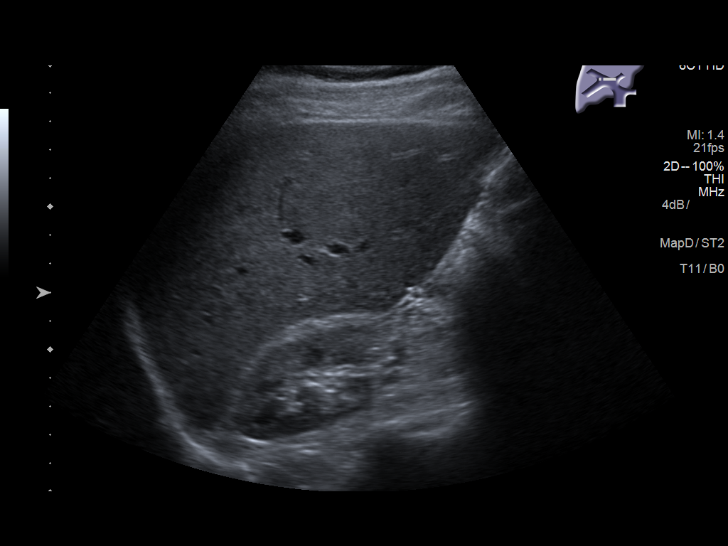
[im 35/39]
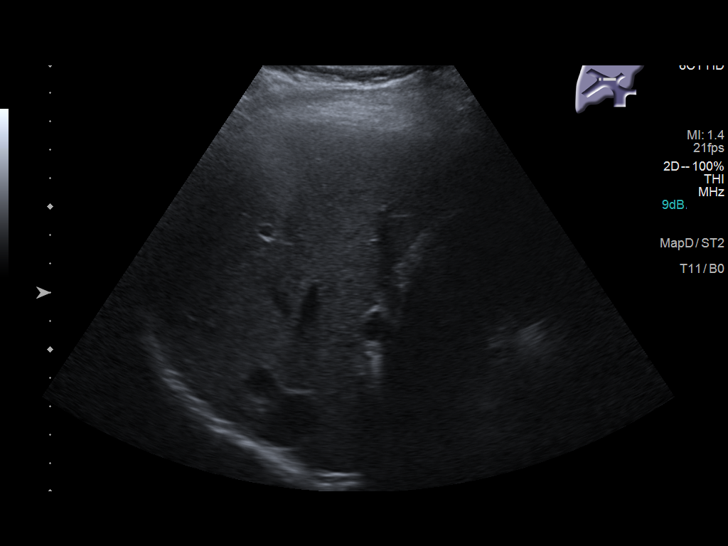
[im 39/39]
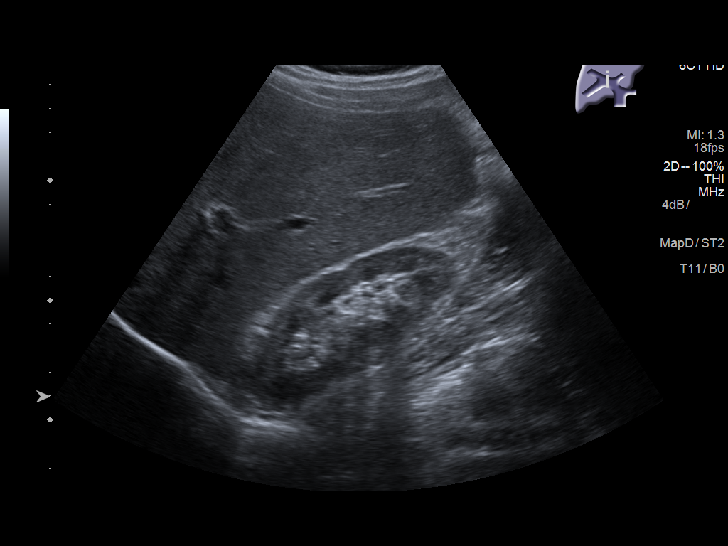

[14 of 25 positions shown; findings below may reference images not displayed]

FINDINGS: Gallbladder:

No gallstones or wall thickening visualized. No sonographic Murphy
sign noted by sonographer.

Common bile duct:

Diameter: 3 mm

Liver:

No focal lesion or intrahepatic biliary duct dilation noted.
Echogenicity top-normal. Portal vein is patent on color Doppler
imaging with normal direction of blood flow towards the liver.
IMPRESSION: Negative right upper quadrant ultrasound.

## 2022-10-01 ENCOUNTER — Encounter: Payer: Self-pay | Admitting: Internal Medicine

## 2022-10-01 ENCOUNTER — Ambulatory Visit: Payer: Managed Care, Other (non HMO) | Admitting: Internal Medicine

## 2022-10-01 VITALS — BP 136/82 | HR 84 | Temp 98.5°F | Resp 16 | Ht 67.0 in | Wt 172.8 lb

## 2022-10-01 DIAGNOSIS — Z114 Encounter for screening for human immunodeficiency virus [HIV]: Secondary | ICD-10-CM | POA: Diagnosis not present

## 2022-10-01 DIAGNOSIS — Z0000 Encounter for general adult medical examination without abnormal findings: Secondary | ICD-10-CM | POA: Diagnosis not present

## 2022-10-01 DIAGNOSIS — Z1211 Encounter for screening for malignant neoplasm of colon: Secondary | ICD-10-CM

## 2022-10-01 LAB — CBC WITH DIFFERENTIAL/PLATELET
Absolute Monocytes: 420 cells/uL (ref 200–950)
Basophils Absolute: 38 cells/uL (ref 0–200)
Eosinophils Relative: 1.3 %
Hemoglobin: 15.9 g/dL (ref 13.2–17.1)
MCH: 31.8 pg (ref 27.0–33.0)
MCHC: 34.6 g/dL (ref 32.0–36.0)
MCV: 92 fL (ref 80.0–100.0)
Monocytes Relative: 5.6 %
Neutrophils Relative %: 78.5 %
Platelets: 330 10*3/uL (ref 140–400)
RBC: 5 10*6/uL (ref 4.20–5.80)
RDW: 12.8 % (ref 11.0–15.0)
WBC: 7.5 10*3/uL (ref 3.8–10.8)

## 2022-10-01 LAB — HIV ANTIBODY (ROUTINE TESTING W REFLEX): HIV 1&2 Ab, 4th Generation: NONREACTIVE

## 2022-10-01 NOTE — Progress Notes (Signed)
New Patient Office Visit  Subjective    Patient ID: Roy Edwards, male    DOB: Aug 18, 1976  Age: 47 y.o. MRN: [id number]  CC:  Chief Complaint  Patient presents with   Establish Care    HPI Roy Edwards presents to establish care. He had never been diagnosed with any chronic medical conditions and takes no daily medications other than a multivitamin. He has a history of shingles flare when he was 30 but was treated and has no other issues.   Health Maintenance: -Blood work due -Colon cancer screening due   Outpatient Encounter Medications as of 10/01/2022  Medication Sig   Multiple Vitamin (MULTIVITAMIN) tablet Take 1 tablet by mouth daily.   No facility-administered encounter medications on file as of 10/01/2022.    Past Medical History:  Diagnosis Date   Shingles     History reviewed. No pertinent surgical history.  Family History  Problem Relation Age of Onset   Graves' disease Mother    Diabetes Father    Atrial fibrillation Father     Social History   Socioeconomic History   Marital status: Single    Spouse name: Not on file   Number of children: Not on file   Years of education: Not on file   Highest education level: Not on file  Occupational History   Not on file  Tobacco Use   Smoking status: Former   Smokeless tobacco: Never  Vaping Use   Vaping Use: Never used  Substance and Sexual Activity   Alcohol use: No   Drug use: No   Sexual activity: Never    Birth control/protection: None  Other Topics Concern   Not on file  Social History Narrative   Not on file   Social Determinants of Health   Financial Resource Strain: Not on file  Food Insecurity: Not on file  Transportation Needs: Not on file  Physical Activity: Not on file  Stress: Not on file  Social Connections: Not on file  Intimate Partner Violence: Not on file    Review of Systems  All other systems reviewed and are negative.       Objective    BP 136/82   Pulse 84    Temp 98.5 F (36.9 C)   Resp 16   Ht 5\' 7"  (1.702 m)   Wt 172 lb 12.8 oz (78.4 kg)   SpO2 99%   BMI 27.06 kg/m   Physical Exam Constitutional:      Appearance: Normal appearance.  HENT:     Head: Normocephalic and atraumatic.     Mouth/Throat:     Mouth: Mucous membranes are moist.     Pharynx: Oropharynx is clear.  Eyes:     Conjunctiva/sclera: Conjunctivae normal.  Cardiovascular:     Rate and Rhythm: Normal rate and regular rhythm.  Pulmonary:     Effort: Pulmonary effort is normal.     Breath sounds: Normal breath sounds.  Musculoskeletal:     Right lower leg: No edema.     Left lower leg: No edema.  Lymphadenopathy:     Cervical: No cervical adenopathy.  Skin:    General: Skin is warm and dry.  Neurological:     General: No focal deficit present.     Mental Status: He is alert. Mental status is at baseline.  Psychiatric:        Mood and Affect: Mood normal.        Behavior: Behavior normal.  Assessment & Plan:   1. Annual physical exam/Screening for HIV without presence of risk factors: Annual labs due today. Plan to follow up in 1 year or sooner as needed.   - HIV antibody (with reflex) - CBC w/Diff/Platelet - COMPLETE METABOLIC PANEL WITH GFR - Lipid Profile  2. Screening for colon cancer: Referral placed to GI for screening.   - Ambulatory referral to Gastroenterology  Return in about 1 year (around 10/02/2023).   Margarita Mail, DO

## 2022-10-01 NOTE — Patient Instructions (Signed)
It was great seeing you today!  Plan discussed at today's visit: -Blood work ordered today, results will be uploaded to [facility].  -Referral for colon cancer screening placed  Follow up in: 1 year  Take care and let us know if you have any questions or concerns prior to your next visit.  Dr. Rosana Berger

## 2022-10-02 LAB — COMPLETE METABOLIC PANEL WITH GFR
AG Ratio: 1.8 (calc) (ref 1.0–2.5)
ALT: 25 U/L (ref 9–46)
AST: 19 U/L (ref 10–40)
Albumin: 4.5 g/dL (ref 3.6–5.1)
Alkaline phosphatase (APISO): 67 U/L (ref 36–130)
BUN: 10 mg/dL (ref 7–25)
CO2: 29 mmol/L (ref 20–32)
Calcium: 9.6 mg/dL (ref 8.6–10.3)
Chloride: 105 mmol/L (ref 98–110)
Creat: 0.92 mg/dL (ref 0.60–1.29)
Globulin: 2.5 g/dL (calc) (ref 1.9–3.7)
Glucose, Bld: 98 mg/dL (ref 65–99)
Potassium: 4.8 mmol/L (ref 3.5–5.3)
Sodium: 141 mmol/L (ref 135–146)
Total Bilirubin: 1.2 mg/dL (ref 0.2–1.2)
Total Protein: 7 g/dL (ref 6.1–8.1)
eGFR: 105 mL/min/{1.73_m2} (ref >=60)

## 2022-10-02 LAB — CBC WITH DIFFERENTIAL/PLATELET
Basophils Relative: 0.5 %
Eosinophils Absolute: 98 cells/uL (ref 15–500)
HCT: 46 % (ref 38.5–50.0)
Lymphs Abs: 1058 cells/uL (ref 850–3900)
MPV: 10.9 fL (ref 7.5–12.5)
Neutro Abs: 5888 cells/uL (ref 1500–7800)
Total Lymphocyte: 14.1 %

## 2022-10-02 LAB — LIPID PANEL
Cholesterol: 203 mg/dL — ABNORMAL HIGH (ref <200)
HDL: 48 mg/dL (ref >=40)
LDL Cholesterol (Calc): 126 mg/dL (calc) — ABNORMAL HIGH
Non-HDL Cholesterol (Calc): 155 mg/dL (calc) — ABNORMAL HIGH (ref <130)
Total CHOL/HDL Ratio: 4.2 (calc) (ref <5.0)
Triglycerides: 174 mg/dL — ABNORMAL HIGH (ref <150)

## 2023-10-04 ENCOUNTER — Other Ambulatory Visit: Payer: Self-pay

## 2023-10-04 ENCOUNTER — Ambulatory Visit (INDEPENDENT_AMBULATORY_CARE_PROVIDER_SITE_OTHER): Payer: Managed Care, Other (non HMO) | Admitting: Internal Medicine

## 2023-10-04 ENCOUNTER — Telehealth: Payer: Self-pay

## 2023-10-04 ENCOUNTER — Encounter: Payer: Self-pay | Admitting: Internal Medicine

## 2023-10-04 VITALS — BP 120/80 | HR 94 | Temp 98.3°F | Resp 16 | Ht 67.0 in | Wt 168.9 lb

## 2023-10-04 DIAGNOSIS — Z1211 Encounter for screening for malignant neoplasm of colon: Secondary | ICD-10-CM | POA: Diagnosis not present

## 2023-10-04 DIAGNOSIS — Z13220 Encounter for screening for lipoid disorders: Secondary | ICD-10-CM | POA: Diagnosis not present

## 2023-10-04 DIAGNOSIS — Z0000 Encounter for general adult medical examination without abnormal findings: Secondary | ICD-10-CM

## 2023-10-04 MED ORDER — NA SULFATE-K SULFATE-MG SULF 17.5-3.13-1.6 GM/177ML PO SOLN: 1.0000 | Freq: Once | ORAL | 0 refills | Status: AC

## 2023-10-04 NOTE — Telephone Encounter (Signed)
 Gastroenterology Pre-Procedure Review  Request Date: 10/29/23 Requesting Physician: Dr. Therisa  PATIENT REVIEW QUESTIONS: The patient responded to the following health history questions as indicated:    1. Are you having any GI issues? no 2. Do you have a personal history of Polyps?  Brother diverticulitis 3. Do you have a family history of Colon Cancer or Polyps? no 4. Diabetes Mellitus? no 5. Joint replacements in the past 12 months?no 6. Major health problems in the past 3 months?no 7. Any artificial heart valves, MVP, or defibrillator?no    MEDICATIONS & ALLERGIES:    Patient reports the following regarding taking any anticoagulation/antiplatelet therapy:   Plavix, Coumadin, Eliquis, Xarelto, Lovenox, Pradaxa, Brilinta, or Effient? no Aspirin? no  Patient confirms/reports the following medications:  Current Outpatient Medications  Medication Sig Dispense Refill   Multiple Vitamin (MULTIVITAMIN) tablet Take 1 tablet by mouth daily.     No current facility-administered medications for this visit.    Patient confirms/reports the following allergies:  No Known Allergies  No orders of the defined types were placed in this encounter.   AUTHORIZATION INFORMATION Primary Insurance: 1D#: Group #:  Secondary Insurance: 1D#: Group #:  SCHEDULE INFORMATION: Date: 10/29/23 Time: Location: [facility]

## 2023-10-04 NOTE — Progress Notes (Signed)
 Name: Roy Edwards   MRN: [id number]    DOB: 16-Sep-1976   Date:10/04/2023       Progress Note  Subjective  Chief Complaint  Chief Complaint  Patient presents with   Medical Management of Chronic Issues    HPI  Patient presents for annual CPE.  Diet: Regular, well rounded, trying to decrease processed foods in the diet Exercise: 2 days a week 30 minutes - off and on again runner Last Dental Exam: October 2024 Last Eye Exam: 2024  Depression: phq 9 is negative    10/04/2023    8:06 AM 10/01/2022    9:53 AM 08/07/2017   10:28 AM 06/25/2017   12:17 PM 08/15/2015   11:39 AM  Depression screen PHQ 2/9  Decreased Interest 0 0 0 0 0  Down, Depressed, Hopeless 0 0 0 0 0  PHQ - 2 Score 0 0 0 0 0  Altered sleeping  0     Tired, decreased energy  0     Change in appetite  0     Feeling bad or failure about yourself   0     Trouble concentrating  0     Moving slowly or fidgety/restless  0     Suicidal thoughts  0     PHQ-9 Score  0     Difficult doing work/chores  Not difficult at all       Hypertension:  BP Readings from Last 3 Encounters:  10/01/22 136/82  08/07/17 124/76  06/25/17 117/80    Obesity: Wt Readings from Last 3 Encounters:  10/04/23 168 lb 14.4 oz (76.6 kg)  10/01/22 172 lb 12.8 oz (78.4 kg)  08/07/17 159 lb 4.8 oz (72.3 kg)   BMI Readings from Last 3 Encounters:  10/04/23 26.45 kg/m  10/01/22 27.06 kg/m  08/07/17 24.95 kg/m     Flowsheet Row Office Visit from 10/04/2023 in [facility]  AUDIT-C Score 0      Single STD testing and prevention (HIV/chl/gon/syphilis):  no Hep C Screening:  2018 Skin cancer: Discussed monitoring for atypical lesions Colorectal cancer: will schedule Prostate cancer:  not applicable No results found for: PSA   Lung cancer:  Low Dose CT Chest recommended if Age 83-80 years, 30 pack-year currently smoking OR have quit w/in 15years. Patient  is not a candidate for screening   AAA: The  USPSTF recommends one-time screening with ultrasonography in men ages 60 to 75 years who have ever smoked. Patient   is not a candidate for screening   Vaccines:  RSV: not applicable HPV: not applicable Tdap: completed Shingrix: not applicable Pneumonia: not applicable Flu: completed COVID-19: completed  Advanced Care Planning: A voluntary discussion about advance care planning including the explanation and discussion of advance directives.  Discussed health care proxy and Living will, and the patient was able to identify a health care proxy as Roy Edwards (mother).  Patient does not have a living will and power of attorney of health care   Patient Active Problem List   Diagnosis Date Noted   Annual physical exam 04/18/2015    No past surgical history on file.  Family History  Problem Relation Age of Onset   Graves' disease Mother    Diabetes Father    Atrial fibrillation Father     Social History   Socioeconomic History   Marital status: Single    Spouse name: Not on file   Number of children: Not on file   Years of  education: Not on file   Highest education level: Not on file  Occupational History   Not on file  Tobacco Use   Smoking status: Former   Smokeless tobacco: Never  Vaping Use   Vaping status: Never Used  Substance and Sexual Activity   Alcohol use: No   Drug use: No   Sexual activity: Never    Birth control/protection: None  Other Topics Concern   Not on file  Social History Narrative   Not on file   Social Drivers of Health   Financial Resource Strain: Not on file  Food Insecurity: Not on file  Transportation Needs: Not on file  Physical Activity: Not on file  Stress: Not on file  Social Connections: Not on file  Intimate Partner Violence: Not on file     Current Outpatient Medications:    Multiple Vitamin (MULTIVITAMIN) tablet, Take 1 tablet by mouth daily., Disp: , Rfl:   No Known Allergies   Review of Systems  All other systems  reviewed and are negative.     Objective  Vitals:   10/04/23 0804  Pulse: 94  Resp: 16  Temp: 98.3 F (36.8 C)  TempSrc: Oral  SpO2: 98%  Weight: 168 lb 14.4 oz (76.6 kg)  Height: 5' 7 (1.702 m)    Body mass index is 26.45 kg/m.  Physical Exam Constitutional:      Appearance: Normal appearance.  HENT:     Head: Normocephalic and atraumatic.     Mouth/Throat:     Mouth: Mucous membranes are moist.     Pharynx: Oropharynx is clear.  Eyes:     Extraocular Movements: Extraocular movements intact.     Conjunctiva/sclera: Conjunctivae normal.     Pupils: Pupils are equal, round, and reactive to light.  Neck:     Comments: No thyromegaly  Cardiovascular:     Rate and Rhythm: Normal rate and regular rhythm.  Pulmonary:     Effort: Pulmonary effort is normal.     Breath sounds: Normal breath sounds.  Musculoskeletal:     Cervical back: No tenderness.     Right lower leg: No edema.     Left lower leg: No edema.  Lymphadenopathy:     Cervical: No cervical adenopathy.  Skin:    General: Skin is warm and dry.  Neurological:     General: No focal deficit present.     Mental Status: He is alert. Mental status is at baseline.  Psychiatric:        Mood and Affect: Mood normal.        Behavior: Behavior normal.     Last CBC Lab Results  Component Value Date   WBC 7.5 10/01/2022   HGB 15.9 10/01/2022   HCT 46.0 10/01/2022   MCV 92.0 10/01/2022   MCH 31.8 10/01/2022   RDW 12.8 10/01/2022   PLT 330 10/01/2022   Last metabolic panel Lab Results  Component Value Date   GLUCOSE 98 10/01/2022   NA 141 10/01/2022   K 4.8 10/01/2022   CL 105 10/01/2022   CO2 29 10/01/2022   BUN 10 10/01/2022   CREATININE 0.92 10/01/2022   EGFR 105 10/01/2022   CALCIUM 9.6 10/01/2022   PROT 7.0 10/01/2022   ALBUMIN 4.5 05/14/2016   LABGLOB 2.4 04/18/2015   AGRATIO 1.8 04/18/2015   BILITOT 1.2 10/01/2022   ALKPHOS 53 05/14/2016   AST 19 10/01/2022   ALT 25 10/01/2022    Last lipids Lab Results  Component Value Date  CHOL 203 (H) 10/01/2022   HDL 48 10/01/2022   LDLCALC 126 (H) 10/01/2022   TRIG 174 (H) 10/01/2022   CHOLHDL 4.2 10/01/2022   Last hemoglobin A1c No results found for: HGBA1C Last thyroid functions Lab Results  Component Value Date   TSH 0.95 06/25/2017   Last vitamin D  Lab Results  Component Value Date   VD25OH 33 06/25/2017   Last vitamin B12 and Folate No results found for: VITAMINB12, FOLATE    Assessment & Plan  1. Annual physical exam (Primary)/Lipid screening: Physical exam completed, health maintenance reviewed and annual labs ordered. Discussed diet appropriate to decrease cholesterol - info printed for the patient to review.   - CBC w/Diff/Platelet - COMPLETE METABOLIC PANEL WITH GFR - Lipid Profile  2. Colon cancer screening: Referral for colon cancer screening placed.   - Ambulatory referral to Gastroenterology   -Prostate cancer screening and PSA options (with potential risks and benefits of testing vs not testing) were discussed along with recent recs/guidelines. -USPSTF grade A and B recommendations reviewed with patient; age-appropriate recommendations, preventive care, screening tests, etc discussed and encouraged; healthy living encouraged; see AVS for patient education given to patient -Discussed importance of 150 minutes of physical activity weekly, eat two servings of fish weekly, eat one serving of tree nuts ( cashews, pistachios, pecans, almonds.SABRA) every other day, eat 6 servings of fruit/vegetables daily and drink plenty of water and avoid sweet beverages.  -Reviewed Health Maintenance: yes

## 2023-10-04 NOTE — Patient Instructions (Signed)

## 2023-10-05 LAB — CBC WITH DIFFERENTIAL/PLATELET
Absolute Lymphocytes: 1649 {cells}/uL (ref 850–3900)
Absolute Monocytes: 608 {cells}/uL (ref 200–950)
Basophils Absolute: 61 {cells}/uL (ref 0–200)
Basophils Relative: 0.8 %
Eosinophils Absolute: 220 {cells}/uL (ref 15–500)
Eosinophils Relative: 2.9 %
HCT: 50.5 % — ABNORMAL HIGH (ref 38.5–50.0)
Hemoglobin: 17.1 g/dL (ref 13.2–17.1)
MCH: 31.8 pg (ref 27.0–33.0)
MCHC: 33.9 g/dL (ref 32.0–36.0)
MCV: 94 fL (ref 80.0–100.0)
MPV: 10.7 fL (ref 7.5–12.5)
Monocytes Relative: 8 %
Neutro Abs: 5062 {cells}/uL (ref 1500–7800)
Neutrophils Relative %: 66.6 %
Platelets: 344 10*3/uL (ref 140–400)
RBC: 5.37 10*6/uL (ref 4.20–5.80)
RDW: 13 % (ref 11.0–15.0)
Total Lymphocyte: 21.7 %
WBC: 7.6 10*3/uL (ref 3.8–10.8)

## 2023-10-05 LAB — COMPLETE METABOLIC PANEL WITH GFR
AG Ratio: 2.1 (calc) (ref 1.0–2.5)
ALT: 31 U/L (ref 9–46)
AST: 19 U/L (ref 10–40)
Albumin: 4.8 g/dL (ref 3.6–5.1)
Alkaline phosphatase (APISO): 77 U/L (ref 36–130)
BUN: 11 mg/dL (ref 7–25)
CO2: 30 mmol/L (ref 20–32)
Calcium: 9.7 mg/dL (ref 8.6–10.3)
Chloride: 102 mmol/L (ref 98–110)
Creat: 1 mg/dL (ref 0.60–1.29)
Globulin: 2.3 g/dL (ref 1.9–3.7)
Glucose, Bld: 99 mg/dL (ref 65–99)
Potassium: 4.6 mmol/L (ref 3.5–5.3)
Sodium: 141 mmol/L (ref 135–146)
Total Bilirubin: 1.5 mg/dL — ABNORMAL HIGH (ref 0.2–1.2)
Total Protein: 7.1 g/dL (ref 6.1–8.1)
eGFR: 94 mL/min/{1.73_m2} (ref >=60)

## 2023-10-05 LAB — LIPID PANEL
Cholesterol: 203 mg/dL — ABNORMAL HIGH (ref <200)
HDL: 44 mg/dL (ref >=40)
LDL Cholesterol (Calc): 130 mg/dL — ABNORMAL HIGH
Non-HDL Cholesterol (Calc): 159 mg/dL — ABNORMAL HIGH (ref <130)
Total CHOL/HDL Ratio: 4.6 (calc) (ref <5.0)
Triglycerides: 171 mg/dL — ABNORMAL HIGH (ref <150)

## 2023-10-29 ENCOUNTER — Encounter
Admission: RE | Disposition: A | Discharge status: Home / Self Care | Payer: Self-pay | Source: Home / Self Care | Attending: Gastroenterology

## 2023-10-29 ENCOUNTER — Ambulatory Visit: Payer: Managed Care, Other (non HMO) | Admitting: Anesthesiology

## 2023-10-29 ENCOUNTER — Ambulatory Visit
Admission: RE | Admit: 2023-10-29 | Discharge: 2023-10-29 | Disposition: A | Discharge status: Home / Self Care | Payer: Managed Care, Other (non HMO) | Attending: Gastroenterology | Admitting: Gastroenterology

## 2023-10-29 ENCOUNTER — Encounter: Payer: Self-pay | Admitting: Gastroenterology

## 2023-10-29 DIAGNOSIS — Z87891 Personal history of nicotine dependence: Secondary | ICD-10-CM | POA: Insufficient documentation

## 2023-10-29 DIAGNOSIS — K640 First degree hemorrhoids: Secondary | ICD-10-CM | POA: Diagnosis not present

## 2023-10-29 DIAGNOSIS — D126 Benign neoplasm of colon, unspecified: Secondary | ICD-10-CM

## 2023-10-29 DIAGNOSIS — Z800 Family history of malignant neoplasm of digestive organs: Secondary | ICD-10-CM | POA: Insufficient documentation

## 2023-10-29 DIAGNOSIS — D122 Benign neoplasm of ascending colon: Secondary | ICD-10-CM

## 2023-10-29 DIAGNOSIS — Z1211 Encounter for screening for malignant neoplasm of colon: Secondary | ICD-10-CM

## 2023-10-29 HISTORY — PX: POLYPECTOMY: SHX5525

## 2023-10-29 HISTORY — PX: COLONOSCOPY WITH PROPOFOL: SHX5780

## 2023-10-29 SURGERY — COLONOSCOPY WITH PROPOFOL
Anesthesia: General

## 2023-10-29 MED ORDER — PROPOFOL 10 MG/ML IV BOLUS
INTRAVENOUS | Status: DC | PRN
  Administered 2023-10-29: 70 mg via INTRAVENOUS

## 2023-10-29 MED ORDER — PHENYLEPHRINE HCL (PRESSORS) 10 MG/ML IV SOLN
INTRAVENOUS | Status: DC | PRN
  Administered 2023-10-29: 80 ug via INTRAVENOUS

## 2023-10-29 MED ORDER — SODIUM CHLORIDE 0.9 % IV SOLN: INTRAVENOUS | Status: DC

## 2023-10-29 MED ORDER — PROPOFOL 500 MG/50ML IV EMUL
INTRAVENOUS | Status: DC | PRN
  Administered 2023-10-29: 140 ug/kg/min via INTRAVENOUS

## 2023-10-29 NOTE — Anesthesia Postprocedure Evaluation (Signed)
 Anesthesia Post Note  Patient: Roy Edwards  Procedure(s) Performed: COLONOSCOPY WITH PROPOFOL  POLYPECTOMY  Patient location during evaluation: Endoscopy Anesthesia Type: General Level of consciousness: awake and alert Pain management: pain level controlled Vital Signs Assessment: post-procedure vital signs reviewed and stable Respiratory status: spontaneous breathing, nonlabored ventilation, respiratory function stable and patient connected to nasal cannula oxygen Cardiovascular status: blood pressure returned to baseline and stable Postop Assessment: no apparent nausea or vomiting Anesthetic complications: no   No notable events documented.   Last Vitals:  Vitals:   10/29/23 1020 10/29/23 1030  BP: 109/69 111/79  Pulse: 81   Resp: 18   Temp: (!) 27.8 C   SpO2: 98%     Last Pain:  Vitals:   10/29/23 1040  TempSrc:   PainSc: 0-No pain                 [name]

## 2023-10-29 NOTE — Transfer of Care (Signed)
 Immediate Anesthesia Transfer of Care Note  Patient: Roy Edwards  Procedure(s) Performed: COLONOSCOPY WITH PROPOFOL  POLYPECTOMY  Patient Location: PACU  Anesthesia Type:General  Level of Consciousness: awake, alert , and oriented  Airway & Oxygen Therapy: Patient Spontanous Breathing  Post-op Assessment: Report given to RN and Post -op Vital signs reviewed and stable  Post vital signs: Reviewed and stable  Last Vitals:  Vitals Value Taken Time  BP 109/69 10/29/23 1021  Temp 27.8 C 10/29/23 1020  Pulse 85 10/29/23 1021  Resp 18 10/29/23 1020  SpO2 98 % 10/29/23 1021  Vitals shown include unfiled device data.  Last Pain:  Vitals:   10/29/23 1020  TempSrc: Temporal  PainSc: 0-No pain         Complications: No notable events documented.

## 2023-10-29 NOTE — Anesthesia Preprocedure Evaluation (Addendum)
Anesthesia Evaluation  Patient identified by MRN, date of birth, ID band Patient awake    Reviewed: Allergy & Precautions, NPO status , Patient's Chart, lab work & pertinent test results  History of Anesthesia Complications Negative for: history of anesthetic complications  Airway Mallampati: I  TM Distance: >3 FB Neck ROM: full    Dental no notable dental hx.    Pulmonary former smoker   Pulmonary exam normal        Cardiovascular negative cardio ROS Normal cardiovascular exam     Neuro/Psych negative neurological ROS  negative psych ROS   GI/Hepatic negative GI ROS, Neg liver ROS,,,  Endo/Other  negative endocrine ROS    Renal/GU negative Renal ROS  negative genitourinary   Musculoskeletal   Abdominal   Peds  Hematology negative hematology ROS (+)   Anesthesia Other Findings Past Medical History: No date: Shingles  Past Surgical History: No date: WISDOM TOOTH EXTRACTION  BMI    Body Mass Index: 25.22 kg/m      Reproductive/Obstetrics negative OB ROS                             Anesthesia Physical Anesthesia Plan  ASA: 1  Anesthesia Plan: General   Post-op Pain Management: Minimal or no pain anticipated   Induction: Intravenous  PONV Risk Score and Plan: 1 and Propofol infusion and TIVA  Airway Management Planned: Natural Airway and Nasal Cannula  Additional Equipment:   Intra-op Plan:   Post-operative Plan:   Informed Consent: I have reviewed the patients History and Physical, chart, labs and discussed the procedure including the risks, benefits and alternatives for the proposed anesthesia with the patient or authorized representative who has indicated his/her understanding and acceptance.     Dental Advisory Given  Plan Discussed with: Anesthesiologist, CRNA and Surgeon  Anesthesia Plan Comments: (Patient consented for risks of anesthesia including but not  limited to:  - adverse reactions to medications - risk of airway placement if required - damage to eyes, teeth, lips or other oral mucosa - nerve damage due to positioning  - sore throat or hoarseness - Damage to heart, brain, nerves, lungs, other parts of body or loss of life  Patient voiced understanding and assent.)       Anesthesia Quick Evaluation

## 2023-10-29 NOTE — Op Note (Signed)
 [facility] Patient Name: Roy Edwards Procedure Date: 10/29/2023 9:24 AM MRN: [id number] Account #: [id number] Date of Birth: 03/31/1976 Admit Type: Outpatient Age: 48 Room: [facility] ENDO ROOM 2 Gender: Male Note Status: Finalized Instrument Name: [id number] Procedure:             Colonoscopy Indications:           Screening in patient at increased risk: Family history                         of 1st-degree relative with colorectal cancer Providers:             Ruel Kung MD, MD Referring MD:          Ruel Kung MD, MD (Referring MD), Sharyle Fischer                         (Referring MD) Medicines:             Monitored Anesthesia Care Complications:         No immediate complications. Procedure:             Pre-Anesthesia Assessment:                        - Prior to the procedure, a History and Physical was                         performed, and patient medications, allergies and                         sensitivities were reviewed. The patient's tolerance                         of previous anesthesia was reviewed.                        - The risks and benefits of the procedure and the                         sedation options and risks were discussed with the                         patient. All questions were answered and informed                         consent was obtained.                        - ASA Grade Assessment: II - A patient with mild                         systemic disease.                        After obtaining informed consent, the colonoscope was                         passed under direct vision. Throughout the procedure,                         the  patient's blood pressure, pulse, and oxygen                         saturations were monitored continuously. The                         Colonoscope was introduced through the anus and                         advanced to the the cecum, identified by the                          appendiceal orifice. The colonoscopy was performed                         with ease. The patient tolerated the procedure well.                         The quality of the bowel preparation was excellent.                         The ileocecal valve, appendiceal orifice, and rectum                         were photographed. Findings:      The perianal and digital rectal examinations were normal.      Non-bleeding internal hemorrhoids were found during retroflexion. The       hemorrhoids were medium-sized and Grade I (internal hemorrhoids that do       not prolapse).      A 7 mm polyp was found in the distal ascending colon. The polyp was       sessile. The polyp was removed with a cold snare. Resection and       retrieval were complete.      The exam was otherwise without abnormality on direct and retroflexion       views. Impression:            - Non-bleeding internal hemorrhoids.                        - One 7 mm polyp in the distal ascending colon,                         removed with a cold snare. Resected and retrieved.                        - The examination was otherwise normal on direct and                         retroflexion views. Recommendation:        - Discharge patient to home (with escort).                        - Resume previous diet.                        - Continue present medications.                        -  Await pathology results.                        - Repeat colonoscopy in 5 years for surveillance. Procedure Code(s):     --- Professional ---                        [phone], Colonoscopy, flexible; with removal of                         tumor(s), polyp(s), or other lesion(s) by snare                         technique Diagnosis Code(s):     --- Professional ---                        Z80.0, Family history of malignant neoplasm of                         digestive organs                        D12.2, Benign neoplasm of ascending colon                         K64.0, First degree hemorrhoids CPT copyright 2022 [employer]. All rights reserved. The codes documented in this report are preliminary and upon coder review may  be revised to meet current compliance requirements. Ruel Kung, MD Ruel Kung MD, MD 10/29/2023 10:18:23 AM This report has been signed electronically. Number of Addenda: 0 Note Initiated On: 10/29/2023 9:24 AM Scope Withdrawal Time: 0 hours 10 minutes 20 seconds  Total Procedure Duration: 0 hours 12 minutes 4 seconds  Estimated Blood Loss:  Estimated blood loss: none.      [facility]

## 2023-10-29 NOTE — H&P (Signed)
Roy Mood, MD [address], [address], [place], [place], [address] [address], [place], [place], [address] Phone: [phone]  Fax: [phone]  Primary Care Physician:  Margarita Mail, DO   Pre-Procedure History & Physical: HPI:  Roy Edwards is a 48 y.o. male is here for an colonoscopy.   Past Medical History:  Diagnosis Date   Shingles     No past surgical history on file.  Prior to Admission medications   Medication Sig Start Date End Date Taking? Authorizing Provider  Multiple Vitamin (MULTIVITAMIN) tablet Take 1 tablet by mouth daily.    [provider]    Allergies as of 10/04/2023   (No Known Allergies)    Family History  Problem Relation Age of Onset   Graves' disease Mother    Diabetes Father    Atrial fibrillation Father     Social History   Socioeconomic History   Marital status: Single    Spouse name: Not on file   Number of children: Not on file   Years of education: Not on file   Highest education level: Not on file  Occupational History   Not on file  Tobacco Use   Smoking status: Former   Smokeless tobacco: Never  Vaping Use   Vaping status: Never Used  Substance and Sexual Activity   Alcohol use: No   Drug use: No   Sexual activity: Never    Birth control/protection: None  Other Topics Concern   Not on file  Social History Narrative   Not on file   Social Drivers of Health   Financial Resource Strain: Low Risk  (10/04/2023)   Overall Financial Resource Strain (CARDIA)    Difficulty of Paying Living Expenses: Not hard at all  Food Insecurity: No Food Insecurity (10/04/2023)   Hunger Vital Sign    Worried About Running Out of Food in the Last Year: Never true    Ran Out of Food in the Last Year: Never true  Transportation Needs: No Transportation Needs (10/04/2023)   PRAPARE - Administrator, Civil Service (Medical): No    Lack of Transportation (Non-Medical): No  Physical Activity:  Insufficiently Active (10/04/2023)   Exercise Vital Sign    Days of Exercise per Week: 2 days    Minutes of Exercise per Session: 30 min  Stress: No Stress Concern Present (10/04/2023)   [employer] of Occupational Health - Occupational Stress Questionnaire    Feeling of Stress : Only a little  Social Connections: Moderately Integrated (10/04/2023)   Social Connection and Isolation Panel [NHANES]    Frequency of Communication with Friends and Family: More than three times a week    Frequency of Social Gatherings with Friends and Family: Not on file    Attends Religious Services: More than 4 times per year    Active Member of [employer] or Organizations: Yes    Attends Banker Meetings: More than 4 times per year    Marital Status: Never married  Intimate Partner Violence: Not At Risk (10/04/2023)   Humiliation, Afraid, Rape, and Kick questionnaire    Fear of Current or Ex-Partner: No    Emotionally Abused: No    Physically Abused: No    Sexually Abused: No    Review of Systems: See HPI, otherwise negative ROS  Physical Exam: There were no vitals taken for this visit. General:   Alert,  pleasant and cooperative in NAD Head:  Normocephalic and atraumatic. Neck:  Supple; no masses or thyromegaly. Lungs:  Clear throughout to auscultation, normal respiratory effort.    Heart:  +S1, +S2, Regular rate and rhythm, No edema. Abdomen:  Soft, nontender and nondistended. Normal bowel sounds, without guarding, and without rebound.   Neurologic:  Alert and  oriented x4;  grossly normal neurologically.  Impression/Plan: Roy Edwards is here for an colonoscopy to be performed for Screening colonoscopy ,father had colon cancer Risks, benefits, limitations, and alternatives regarding  colonoscopy have been reviewed with the patient.  Questions have been answered.  All parties agreeable.   Roy Mood, MD  10/29/2023, 8:47 AM

## 2023-10-30 ENCOUNTER — Encounter: Payer: Self-pay | Admitting: Gastroenterology

## 2023-10-30 LAB — SURGICAL PATHOLOGY

## 2023-11-12 ENCOUNTER — Encounter: Payer: Self-pay | Admitting: Gastroenterology

## 2024-10-05 ENCOUNTER — Other Ambulatory Visit: Payer: Self-pay

## 2024-10-05 ENCOUNTER — Encounter: Payer: Self-pay | Admitting: Internal Medicine

## 2024-10-05 ENCOUNTER — Ambulatory Visit: Payer: Self-pay | Admitting: Internal Medicine

## 2024-10-05 VITALS — BP 118/72 | HR 77 | Temp 98.4°F | Resp 16 | Ht 67.0 in | Wt 168.4 lb

## 2024-10-05 DIAGNOSIS — Z0001 Encounter for general adult medical examination with abnormal findings: Secondary | ICD-10-CM | POA: Diagnosis not present

## 2024-10-05 DIAGNOSIS — Z23 Encounter for immunization: Secondary | ICD-10-CM | POA: Diagnosis not present

## 2024-10-05 DIAGNOSIS — Z0000 Encounter for general adult medical examination without abnormal findings: Secondary | ICD-10-CM

## 2024-10-05 DIAGNOSIS — E782 Mixed hyperlipidemia: Secondary | ICD-10-CM

## 2024-10-05 NOTE — Progress Notes (Signed)
 Name: Roy Edwards   MRN: [id number]    DOB: 1976-04-29   Date:10/05/2024       Progress Note  Subjective  Chief Complaint  Chief Complaint  Patient presents with   Medical Management of Chronic Issues    HPI  Patient presents for annual CPE .  Discussed the use of AI scribe software for clinical note transcription with the patient, who gave verbal consent to proceed.  History of Present Illness Roy Edwards is a 49 year old male presenting for an annual physical exam and vaccination update.  He is due for a tetanus vaccine, last received in July 2015. He received a flu vaccine at a work clinic in September 2025 but has not received a COVID vaccine this year.  He uses daily fish oil for cholesterol and is trying to reduce red meat and increase lean proteins, though he finds dietary changes challenging.  He has resumed running and completed a 5K on Thanksgiving without walking. He runs intermittently and is working on endurance and pace. His heart rate often reaches zone four during runs and he is looking for ways to manage this.  He has no family history of prostate cancer. He had a colonoscopy last year with a recommendation to repeat in five years.   Diet: regular Exercise: 3 days 30 minutes - getting back into running routinely  Last Dental Exam: completed Last Eye Exam: completed  Depression: phq 9 is negative    10/05/2024    8:09 AM 10/04/2023    8:06 AM 10/01/2022    9:53 AM 08/07/2017   10:28 AM 06/25/2017   12:17 PM  Depression screen PHQ 2/9  Decreased Interest 0 0 0 0 0  Down, Depressed, Hopeless 0 0 0 0 0  PHQ - 2 Score 0 0 0 0 0  Altered sleeping   0    Tired, decreased energy   0    Change in appetite   0    Feeling bad or failure about yourself    0    Trouble concentrating   0    Moving slowly or fidgety/restless   0    Suicidal thoughts   0    PHQ-9 Score   0     Difficult doing work/chores   Not difficult at all       Data saved with a previous  flowsheet row definition    Hypertension:  BP Readings from Last 3 Encounters:  10/05/24 118/72  10/29/23 111/79  10/04/23 120/80    Obesity: Wt Readings from Last 3 Encounters:  10/05/24 168 lb 6.4 oz (76.4 kg)  10/29/23 161 lb (73 kg)  10/04/23 168 lb 14.4 oz (76.6 kg)   BMI Readings from Last 3 Encounters:  10/05/24 26.38 kg/m  10/29/23 25.22 kg/m  10/04/23 26.45 kg/m     Flowsheet Row Office Visit from 10/05/2024 in [facility]  AUDIT-C Score 1     The 10-year ASCVD risk score (Arnett DK, et al., 2019) is: 2.6%   Values used to calculate the score:     Age: 74 years     Clinically relevant sex: Male     Is Non-Hispanic African American: No     Diabetic: No     Tobacco smoker: No     Systolic Blood Pressure: 118 mmHg     Is BP treated: No     HDL Cholesterol: 44 mg/dL     Total Cholesterol: 203 mg/dL   Single STD testing  and prevention (HIV/chl/gon/syphilis):  not applicable Skin cancer: Discussed monitoring for atypical lesions Colorectal cancer: 10/29/2023, repeat in 5 years Prostate cancer:  not applicable - will start screening at age 20 No results found for: PSA   Lung cancer:  Low Dose CT Chest recommended if Age 2-80 years, 30 pack-year currently smoking OR have quit w/in 15years. Patient  is not a candidate for screening   AAA: The USPSTF recommends one-time screening with ultrasonography in men ages 80 to 75 years who have ever smoked. Patient   is not a candidate for screening  ECG:  NA  Vaccines: Tdap reviewed with the patient.   Advanced Care Planning: A voluntary discussion about advance care planning including the explanation and discussion of advance directives.  Discussed health care proxy and Living will, and the patient was able to identify a health care proxy .  Patient does not have a living will and power of attorney of health care   Patient Active Problem List   Diagnosis Date Noted   Adenomatous polyp  of colon 10/29/2023   Encounter for screening colonoscopy 04/18/2015    Past Surgical History:  Procedure Laterality Date   COLONOSCOPY WITH PROPOFOL  N/A 10/29/2023   Procedure: COLONOSCOPY WITH PROPOFOL ;  Surgeon: Therisa Bi, MD;  Location: [facility] ENDOSCOPY;  Service: Gastroenterology;  Laterality: N/A;   POLYPECTOMY  10/29/2023   Procedure: POLYPECTOMY;  Surgeon: Therisa Bi, MD;  Location: [facility] ENDOSCOPY;  Service: Gastroenterology;;   SHELLEE TOOTH EXTRACTION      Family History  Problem Relation Age of Onset   Graves' disease Mother    Diabetes Father    Atrial fibrillation Father     Social History   Socioeconomic History   Marital status: Single    Spouse name: Not on file   Number of children: Not on file   Years of education: Not on file   Highest education level: Master's degree (e.g., MA, MS, MEng, MEd, MSW, MBA)  Occupational History   Not on file  Tobacco Use   Smoking status: Former   Smokeless tobacco: Never  Vaping Use   Vaping status: Never Used  Substance and Sexual Activity   Alcohol use: No   Drug use: No   Sexual activity: Never    Birth control/protection: None  Other Topics Concern   Not on file  Social History Narrative   Not on file   Social Drivers of Health   Tobacco Use: Medium Risk (10/05/2024)   Patient History    Smoking Tobacco Use: Former    Smokeless Tobacco Use: Never    Passive Exposure: Not on file  Financial Resource Strain: Low Risk (10/01/2024)   Overall Financial Resource Strain (CARDIA)    Difficulty of Paying Living Expenses: Not hard at all  Food Insecurity: No Food Insecurity (10/01/2024)   Epic    Worried About Radiation Protection Practitioner of Food in the Last Year: Never true    Ran Out of Food in the Last Year: Never true  Transportation Needs: No Transportation Needs (10/01/2024)   Epic    Lack of Transportation (Medical): No    Lack of Transportation (Non-Medical): No  Physical Activity: Insufficiently Active (10/01/2024)   Exercise  Vital Sign    Days of Exercise per Week: 3 days    Minutes of Exercise per Session: 30 min  Stress: No Stress Concern Present (10/01/2024)   [employer] of Occupational Health - Occupational Stress Questionnaire    Feeling of Stress: Only a little  Social  Connections: Moderately Integrated (10/01/2024)   Social Connection and Isolation Panel    Frequency of Communication with Friends and Family: More than three times a week    Frequency of Social Gatherings with Friends and Family: More than three times a week    Attends Religious Services: More than 4 times per year    Active Member of Clubs or Organizations: Yes    Attends Banker Meetings: More than 4 times per year    Marital Status: Never married  Intimate Partner Violence: Not At Risk (10/05/2024)   Epic    Fear of Current or Ex-Partner: No    Emotionally Abused: No    Physically Abused: No    Sexually Abused: No  Depression (PHQ2-9): Low Risk (10/05/2024)   Depression (PHQ2-9)    PHQ-2 Score: 0  Alcohol Screen: Low Risk (10/01/2024)   Alcohol Screen    Last Alcohol Screening Score (AUDIT): 1  Housing: Low Risk (10/01/2024)   Epic    Unable to Pay for Housing in the Last Year: No    Number of Times Moved in the Last Year: 0    Homeless in the Last Year: No  Utilities: Not At Risk (10/05/2024)   Epic    Threatened with loss of utilities: No  Health Literacy: Adequate Health Literacy (10/05/2024)   B1300 Health Literacy    Frequency of need for help with medical instructions: Never    Current Medications[1]  Allergies[2]   Review of Systems  All other systems reviewed and are negative.     Objective  Vitals:   10/05/24 0810  BP: 118/72  Pulse: 77  Resp: 16  Temp: 98.4 F (36.9 C)  TempSrc: Oral  SpO2: 97%  Weight: 168 lb 6.4 oz (76.4 kg)  Height: 5' 7 (1.702 m)    Body mass index is 26.38 kg/m.  Physical Exam Constitutional:      Appearance: Normal appearance.  HENT:     Head:  Normocephalic and atraumatic.     Mouth/Throat:     Mouth: Mucous membranes are moist.     Pharynx: Oropharynx is clear.  Eyes:     Extraocular Movements: Extraocular movements intact.     Conjunctiva/sclera: Conjunctivae normal.     Pupils: Pupils are equal, round, and reactive to light.  Neck:     Comments: No thyromegaly Cardiovascular:     Rate and Rhythm: Normal rate and regular rhythm.  Pulmonary:     Effort: Pulmonary effort is normal.     Breath sounds: Normal breath sounds.  Musculoskeletal:     Cervical back: No tenderness.     Right lower leg: No edema.     Left lower leg: No edema.  Lymphadenopathy:     Cervical: No cervical adenopathy.  Skin:    General: Skin is warm and dry.  Neurological:     General: No focal deficit present.     Mental Status: He is alert. Mental status is at baseline.  Psychiatric:        Mood and Affect: Mood normal.        Behavior: Behavior normal.     Last CBC Lab Results  Component Value Date   WBC 7.6 10/04/2023   HGB 17.1 10/04/2023   HCT 50.5 (H) 10/04/2023   MCV 94.0 10/04/2023   MCH 31.8 10/04/2023   RDW 13.0 10/04/2023   PLT 344 10/04/2023   Last metabolic panel Lab Results  Component Value Date   GLUCOSE 99 10/04/2023  NA 141 10/04/2023   K 4.6 10/04/2023   CL 102 10/04/2023   CO2 30 10/04/2023   BUN 11 10/04/2023   CREATININE 1.00 10/04/2023   EGFR 94 10/04/2023   CALCIUM 9.7 10/04/2023   PROT 7.1 10/04/2023   ALBUMIN 4.5 05/14/2016   LABGLOB 2.4 04/18/2015   AGRATIO 1.8 04/18/2015   BILITOT 1.5 (H) 10/04/2023   ALKPHOS 53 05/14/2016   AST 19 10/04/2023   ALT 31 10/04/2023   Last lipids Lab Results  Component Value Date   CHOL 203 (H) 10/04/2023   HDL 44 10/04/2023   LDLCALC 130 (H) 10/04/2023   TRIG 171 (H) 10/04/2023   CHOLHDL 4.6 10/04/2023   Last hemoglobin A1c No results found for: HGBA1C Last thyroid functions Lab Results  Component Value Date   TSH 0.95 06/25/2017   Last vitamin  D Lab Results  Component Value Date   VD25OH 33 06/25/2017   Last vitamin B12 and Folate No results found for: VITAMINB12, FOLATE    Assessment & Plan  Assessment & Plan Hyperlipidemia Chronic hyperlipidemia with low 10-year cardiovascular risk. Managing with diet and fish oil. - Ordered lipid panel. - Continue fish oil supplementation. - Encouraged dietary modifications to reduce red meat and increase lean proteins.  Annual Physical/General Health Maintenance Routine health maintenance up to date except tetanus vaccine. Flu vaccine received in September. No COVID vaccine this year. Prostate cancer screening planned at age 67. Colon cancer screening up to date. - Administered tetanus vaccine. - Documented flu vaccine received in September. - Plan prostate cancer screening at age 32. - Continue routine colon cancer screening.  - CBC w/Diff/Platelet - Comprehensive Metabolic Panel (CMET) - Lipid Profile - Tdap vaccine greater than or equal to 7yo IM  -Prostate cancer screening and PSA options (with potential risks and benefits of testing vs not testing) were discussed along with recent recs/guidelines. -USPSTF grade A and B recommendations reviewed with patient; age-appropriate recommendations, preventive care, screening tests, etc discussed and encouraged; healthy living encouraged; see AVS for patient education given to patient -Discussed importance of 150 minutes of physical activity weekly, eat two servings of fish weekly, eat one serving of tree nuts ( cashews, pistachios, pecans, almonds.SABRA) every other day, eat 6 servings of fruit/vegetables daily and drink plenty of water and avoid sweet beverages.  -Reviewed Health Maintenance: yes     [1]  Current Outpatient Medications:    Multiple Vitamin (MULTIVITAMIN) tablet, Take 1 tablet by mouth daily., Disp: , Rfl:    Omega-3 Fatty Acids (FISH OIL BURP-LESS) 1200 MG CAPS, , Disp: , Rfl:  [2] No Known Allergies

## 2024-10-06 ENCOUNTER — Ambulatory Visit: Payer: Self-pay | Admitting: Internal Medicine

## 2024-10-06 LAB — CBC WITH DIFFERENTIAL/PLATELET
Absolute Lymphocytes: 1248 {cells}/uL (ref 850–3900)
Absolute Monocytes: 526 {cells}/uL (ref 200–950)
Basophils Absolute: 44 {cells}/uL (ref 0–200)
Basophils Relative: 0.6 %
Eosinophils Absolute: 153 {cells}/uL (ref 15–500)
Eosinophils Relative: 2.1 %
HCT: 46.8 % (ref 39.4–51.1)
Hemoglobin: 15.7 g/dL (ref 13.2–17.1)
MCH: 31.2 pg (ref 27.0–33.0)
MCHC: 33.5 g/dL (ref 31.6–35.4)
MCV: 93 fL (ref 81.4–101.7)
MPV: 11.1 fL (ref 7.5–12.5)
Monocytes Relative: 7.2 %
Neutro Abs: 5329 {cells}/uL (ref 1500–7800)
Neutrophils Relative %: 73 %
Platelets: 335 Thousand/uL (ref 140–400)
RBC: 5.03 Million/uL (ref 4.20–5.80)
RDW: 12.9 % (ref 11.0–15.0)
Total Lymphocyte: 17.1 %
WBC: 7.3 Thousand/uL (ref 3.8–10.8)

## 2024-10-06 LAB — LIPID PANEL
Cholesterol: 181 mg/dL
HDL: 46 mg/dL
LDL Cholesterol (Calc): 110 mg/dL — ABNORMAL HIGH
Non-HDL Cholesterol (Calc): 135 mg/dL — ABNORMAL HIGH
Total CHOL/HDL Ratio: 3.9 (calc)
Triglycerides: 139 mg/dL

## 2024-10-06 LAB — COMPREHENSIVE METABOLIC PANEL WITH GFR
AG Ratio: 2 (calc) (ref 1.0–2.5)
ALT: 19 U/L (ref 9–46)
AST: 16 U/L (ref 10–40)
Albumin: 4.5 g/dL (ref 3.6–5.1)
Alkaline phosphatase (APISO): 68 U/L (ref 36–130)
BUN: 9 mg/dL (ref 7–25)
CO2: 29 mmol/L (ref 20–32)
Calcium: 9.4 mg/dL (ref 8.6–10.3)
Chloride: 104 mmol/L (ref 98–110)
Creat: 0.87 mg/dL (ref 0.60–1.29)
Globulin: 2.2 g/dL (ref 1.9–3.7)
Glucose, Bld: 98 mg/dL (ref 65–99)
Potassium: 4.6 mmol/L (ref 3.5–5.3)
Sodium: 141 mmol/L (ref 135–146)
Total Bilirubin: 1.2 mg/dL (ref 0.2–1.2)
Total Protein: 6.7 g/dL (ref 6.1–8.1)
eGFR: 107 mL/min/1.73m2

## 2025-10-06 ENCOUNTER — Encounter: Admitting: Internal Medicine
